# Patient Record
Sex: Female | Born: 1935 | Race: Black or African American | Hispanic: No | Smoking: Never smoker
Health system: Southern US, Community
[De-identification: ages and names within clinical notes are randomized; demographics above are authoritative.]

## PROBLEM LIST (undated history)

## (undated) DIAGNOSIS — I10 Essential (primary) hypertension: Secondary | ICD-10-CM

## (undated) DIAGNOSIS — M109 Gout, unspecified: Secondary | ICD-10-CM

## (undated) DIAGNOSIS — I82409 Acute embolism and thrombosis of unspecified deep veins of unspecified lower extremity: Secondary | ICD-10-CM

## (undated) DIAGNOSIS — K219 Gastro-esophageal reflux disease without esophagitis: Secondary | ICD-10-CM

## (undated) DIAGNOSIS — E7800 Pure hypercholesterolemia, unspecified: Secondary | ICD-10-CM

## (undated) DIAGNOSIS — G629 Polyneuropathy, unspecified: Secondary | ICD-10-CM

## (undated) HISTORY — PX: JOINT REPLACEMENT: SHX530

## (undated) HISTORY — PX: ABDOMINAL HYSTERECTOMY: SHX81

---

## 2004-02-18 ENCOUNTER — Ambulatory Visit: Payer: Self-pay | Admitting: Cardiology

## 2004-11-09 ENCOUNTER — Ambulatory Visit: Payer: Self-pay | Admitting: Family Medicine

## 2005-12-14 ENCOUNTER — Ambulatory Visit: Payer: Self-pay | Admitting: Family Medicine

## 2005-12-28 ENCOUNTER — Other Ambulatory Visit: Payer: Self-pay

## 2006-01-09 ENCOUNTER — Inpatient Hospital Stay: Payer: Self-pay | Admitting: General Practice

## 2006-06-04 ENCOUNTER — Ambulatory Visit: Payer: Self-pay | Admitting: Gastroenterology

## 2006-12-18 ENCOUNTER — Ambulatory Visit: Payer: Self-pay | Admitting: Family Medicine

## 2007-12-19 ENCOUNTER — Ambulatory Visit: Payer: Self-pay | Admitting: Family Medicine

## 2009-01-28 ENCOUNTER — Ambulatory Visit: Payer: Self-pay | Admitting: Family Medicine

## 2010-01-31 ENCOUNTER — Ambulatory Visit: Payer: Self-pay | Admitting: Family Medicine

## 2010-05-13 ENCOUNTER — Ambulatory Visit: Payer: Self-pay | Admitting: Family Medicine

## 2011-03-16 ENCOUNTER — Ambulatory Visit: Payer: Self-pay | Admitting: Family Medicine

## 2012-02-15 ENCOUNTER — Ambulatory Visit: Payer: Self-pay | Admitting: Gastroenterology

## 2012-04-10 ENCOUNTER — Ambulatory Visit: Payer: Self-pay | Admitting: Family Medicine

## 2013-09-11 ENCOUNTER — Ambulatory Visit: Payer: Self-pay | Admitting: Family Medicine

## 2014-06-09 ENCOUNTER — Ambulatory Visit: Payer: Self-pay | Admitting: Family Medicine

## 2014-12-26 ENCOUNTER — Emergency Department: Payer: Medicare Other

## 2014-12-26 ENCOUNTER — Encounter: Payer: Self-pay | Admitting: Medical Oncology

## 2014-12-26 ENCOUNTER — Emergency Department
Admission: EM | Admit: 2014-12-26 | Discharge: 2014-12-26 | Disposition: A | Discharge status: Home / Self Care | Payer: Medicare Other | Attending: Emergency Medicine | Admitting: Emergency Medicine

## 2014-12-26 DIAGNOSIS — M25572 Pain in left ankle and joints of left foot: Secondary | ICD-10-CM | POA: Diagnosis not present

## 2014-12-26 DIAGNOSIS — I10 Essential (primary) hypertension: Secondary | ICD-10-CM | POA: Diagnosis not present

## 2014-12-26 DIAGNOSIS — M25562 Pain in left knee: Secondary | ICD-10-CM | POA: Insufficient documentation

## 2014-12-26 DIAGNOSIS — M79662 Pain in left lower leg: Secondary | ICD-10-CM | POA: Diagnosis present

## 2014-12-26 DIAGNOSIS — R52 Pain, unspecified: Secondary | ICD-10-CM

## 2014-12-26 DIAGNOSIS — M1990 Unspecified osteoarthritis, unspecified site: Secondary | ICD-10-CM | POA: Insufficient documentation

## 2014-12-26 HISTORY — DX: Acute embolism and thrombosis of unspecified deep veins of unspecified lower extremity: I82.409

## 2014-12-26 HISTORY — DX: Gastro-esophageal reflux disease without esophagitis: K21.9

## 2014-12-26 HISTORY — DX: Essential (primary) hypertension: I10

## 2014-12-26 HISTORY — DX: Pure hypercholesterolemia, unspecified: E78.00

## 2014-12-26 HISTORY — DX: Gout, unspecified: M10.9

## 2014-12-26 HISTORY — DX: Polyneuropathy, unspecified: G62.9

## 2014-12-26 MED ORDER — PREDNISONE 10 MG PO TABS: 10.0000 mg | ORAL_TABLET | Freq: Every day | ORAL | Status: DC

## 2014-12-26 MED ORDER — PREDNISONE 20 MG PO TABS
ORAL_TABLET | ORAL | Status: AC
  Filled 2014-12-26: qty 1

## 2014-12-26 MED ORDER — OXYCODONE-ACETAMINOPHEN 5-325 MG PO TABS
2.0000 | ORAL_TABLET | Freq: Once | ORAL | Status: AC
  Administered 2014-12-26: 2 via ORAL
  Filled 2014-12-26: qty 2

## 2014-12-26 MED ORDER — OXYCODONE-ACETAMINOPHEN 5-325 MG PO TABS: 1.0000 | ORAL_TABLET | Freq: Four times a day (QID) | ORAL | Status: DC | PRN

## 2014-12-26 MED ORDER — PREDNISONE 20 MG PO TABS
60.0000 mg | ORAL_TABLET | Freq: Once | ORAL | Status: AC
  Administered 2014-12-26: 60 mg via ORAL
  Filled 2014-12-26: qty 3

## 2014-12-26 NOTE — ED Notes (Signed)
Pt reports that she has been having left lower leg pain from calf down to ankle for 2 weeks without injury. Reports pain worsened today. Painful to touch. Good pulse present in foot. Denies sob.

## 2014-12-26 NOTE — Discharge Instructions (Signed)
Arthritis, Nonspecific °Arthritis is inflammation of a joint. This usually means pain, redness, warmth or swelling are present. One or more joints may be involved. There are a number of types of arthritis. Your caregiver may not be able to tell what type of arthritis you have right away. °CAUSES  °The most common cause of arthritis is the wear and tear on the joint (osteoarthritis). This causes damage to the cartilage, which can break down over time. The knees, hips, back and neck are most often affected by this type of arthritis. °Other types of arthritis and common causes of joint pain include: °· Sprains and other injuries near the joint. Sometimes minor sprains and injuries cause pain and swelling that develop hours later. °· Rheumatoid arthritis. This affects hands, feet and knees. It usually affects both sides of your body at the same time. It is often associated with chronic ailments, fever, weight loss and general weakness. °· Crystal arthritis. Gout and pseudo gout can cause occasional acute severe pain, redness and swelling in the foot, ankle, or knee. °· Infectious arthritis. Bacteria can get into a joint through a break in overlying skin. This can cause infection of the joint. Bacteria and viruses can also spread through the blood and affect your joints. °· Drug, infectious and allergy reactions. Sometimes joints can become mildly painful and slightly swollen with these types of illnesses. °SYMPTOMS  °· Pain is the main symptom. °· Your joint or joints can also be red, swollen and warm or hot to the touch. °· You may have a fever with certain types of arthritis, or even feel overall ill. °· The joint with arthritis will hurt with movement. Stiffness is present with some types of arthritis. °DIAGNOSIS  °Your caregiver will suspect arthritis based on your description of your symptoms and on your exam. Testing may be needed to find the type of arthritis: °· Blood and sometimes urine tests. °· X-ray tests  and sometimes CT or MRI scans. °· Removal of fluid from the joint (arthrocentesis) is done to check for bacteria, crystals or other causes. Your caregiver (or a specialist) will numb the area over the joint with a local anesthetic, and use a needle to remove joint fluid for examination. This procedure is only minimally uncomfortable. °· Even with these tests, your caregiver may not be able to tell what kind of arthritis you have. Consultation with a specialist (rheumatologist) may be helpful. °TREATMENT  °Your caregiver will discuss with you treatment specific to your type of arthritis. If the specific type cannot be determined, then the following general recommendations may apply. °Treatment of severe joint pain includes: °· Rest. °· Elevation. °· Anti-inflammatory medication (for example, ibuprofen) may be prescribed. Avoiding activities that cause increased pain. °· Only take over-the-counter or prescription medicines for pain and discomfort as recommended by your caregiver. °· Cold packs over an inflamed joint may be used for 10 to 15 minutes every hour. Hot packs sometimes feel better, but do not use overnight. Do not use hot packs if you are diabetic without your caregiver's permission. °· A cortisone shot into arthritic joints may help reduce pain and swelling. °· Any acute arthritis that gets worse over the next 1 to 2 days needs to be looked at to be sure there is no joint infection. °Long-term arthritis treatment involves modifying activities and lifestyle to reduce joint stress jarring. This can include weight loss. Also, exercise is needed to nourish the joint cartilage and remove waste. This helps keep the muscles   around the joint strong. °HOME CARE INSTRUCTIONS  °· Do not take aspirin to relieve pain if gout is suspected. This elevates uric acid levels. °· Only take over-the-counter or prescription medicines for pain, discomfort or fever as directed by your caregiver. °· Rest the joint as much as  possible. °· If your joint is swollen, keep it elevated. °· Use crutches if the painful joint is in your leg. °· Drinking plenty of fluids may help for certain types of arthritis. °· Follow your caregiver's dietary instructions. °· Try low-impact exercise such as: °¨ Swimming. °¨ Water aerobics. °¨ Biking. °¨ Walking. °· Morning stiffness is often relieved by a warm shower. °· Put your joints through regular range-of-motion. °SEEK MEDICAL CARE IF:  °· You do not feel better in 24 hours or are getting worse. °· You have side effects to medications, or are not getting better with treatment. °SEEK IMMEDIATE MEDICAL CARE IF:  °· You have a fever. °· You develop severe joint pain, swelling or redness. °· Many joints are involved and become painful and swollen. °· There is severe back pain and/or leg weakness. °· You have loss of bowel or bladder control. °Document Released: 05/25/2004 Document Revised: 07/10/2011 Document Reviewed: 06/10/2008 °ExitCare® Patient Information ©2015 [employer]. This information is not intended to replace advice given to you by your health care provider. Make sure you discuss any questions you have with your health care provider. ° °Gout °Gout is an inflammatory arthritis caused by a buildup of uric acid crystals in the joints. Uric acid is a chemical that is normally present in the blood. When the level of uric acid in the blood is too high it can form crystals that deposit in your joints and tissues. This causes joint redness, soreness, and swelling (inflammation). Repeat attacks are common. Over time, uric acid crystals can form into masses (tophi) near a joint, destroying bone and causing disfigurement. Gout is treatable and often preventable. °CAUSES  °The disease begins with elevated levels of uric acid in the blood. Uric acid is produced by your body when it breaks down a naturally found substance called purines. Certain foods you eat, such as meats and fish, contain high amounts of  purines. Causes of an elevated uric acid level include: °· Being passed down from parent to child (heredity). °· Diseases that cause increased uric acid production (such as obesity, psoriasis, and certain cancers). °· Excessive alcohol use. °· Diet, especially diets rich in meat and seafood. °· Medicines, including certain cancer-fighting medicines (chemotherapy), water pills (diuretics), and aspirin. °· Chronic kidney disease. The kidneys are no longer able to remove uric acid well. °· Problems with metabolism. °Conditions strongly associated with gout include: °· Obesity. °· High blood pressure. °· High cholesterol. °· Diabetes. °Not everyone with elevated uric acid levels gets gout. It is not understood why some people get gout and others do not. Surgery, joint injury, and eating too much of certain foods are some of the factors that can lead to gout attacks. °SYMPTOMS  °· An attack of gout comes on quickly. It causes intense pain with redness, swelling, and warmth in a joint. °· Fever can occur. °· Often, only one joint is involved. Certain joints are more commonly involved: °¨ Base of the big toe. °¨ Knee. °¨ Ankle. °¨ Wrist. °¨ Finger. °Without treatment, an attack usually goes away in a few days to weeks. Between attacks, you usually will not have symptoms, which is different from many other forms of arthritis. °DIAGNOSIS  °  Your caregiver will suspect gout based on your symptoms and exam. In some cases, tests may be recommended. The tests may include: °· Blood tests. °· Urine tests. °· X-rays. °· Joint fluid exam. This exam requires a needle to remove fluid from the joint (arthrocentesis). Using a microscope, gout is confirmed when uric acid crystals are seen in the joint fluid. °TREATMENT  °There are two phases to gout treatment: treating the sudden onset (acute) attack and preventing attacks (prophylaxis). °· Treatment of an Acute Attack. °¨ Medicines are used. These include anti-inflammatory medicines or  steroid medicines. °¨ An injection of steroid medicine into the affected joint is sometimes necessary. °¨ The painful joint is rested. Movement can worsen the arthritis. °¨ You may use warm or cold treatments on painful joints, depending which works best for you. °· Treatment to Prevent Attacks. °¨ If you suffer from frequent gout attacks, your caregiver may advise preventive medicine. These medicines are started after the acute attack subsides. These medicines either help your kidneys eliminate uric acid from your body or decrease your uric acid production. You may need to stay on these medicines for a very long time. °¨ The early phase of treatment with preventive medicine can be associated with an increase in acute gout attacks. For this reason, during the first few months of treatment, your caregiver may also advise you to take medicines usually used for acute gout treatment. Be sure you understand your caregiver's directions. Your caregiver may make several adjustments to your medicine dose before these medicines are effective. °¨ Discuss dietary treatment with your caregiver or dietitian. Alcohol and drinks high in sugar and fructose and foods such as meat, poultry, and seafood can increase uric acid levels. Your caregiver or dietitian can advise you on drinks and foods that should be limited. °HOME CARE INSTRUCTIONS  °· Do not take aspirin to relieve pain. This raises uric acid levels. °· Only take over-the-counter or prescription medicines for pain, discomfort, or fever as directed by your caregiver. °· Rest the joint as much as possible. When in bed, keep sheets and blankets off painful areas. °· Keep the affected joint raised (elevated). °· Apply warm or cold treatments to painful joints. Use of warm or cold treatments depends on which works best for you. °· Use crutches if the painful joint is in your leg. °· Drink enough fluids to keep your urine clear or pale yellow. This helps your body get rid of uric  acid. Limit alcohol, sugary drinks, and fructose drinks. °· Follow your dietary instructions. Pay careful attention to the amount of protein you eat. Your daily diet should emphasize fruits, vegetables, whole grains, and fat-free or low-fat milk products. Discuss the use of coffee, vitamin C, and cherries with your caregiver or dietitian. These may be helpful in lowering uric acid levels. °· Maintain a healthy body weight. °SEEK MEDICAL CARE IF:  °· You develop diarrhea, vomiting, or any side effects from medicines. °· You do not feel better in 24 hours, or you are getting worse. °SEEK IMMEDIATE MEDICAL CARE IF:  °· Your joint becomes suddenly more tender, and you have chills or a fever. °MAKE SURE YOU:  °· Understand these instructions. °· Will watch your condition. °· Will get help right away if you are not doing well or get worse. °Document Released: 04/14/2000 Document Revised: 09/01/2013 Document Reviewed: 11/29/2011 °ExitCare® Patient Information ©2015 [employer]. This information is not intended to replace advice given to you by your health care provider. Make   sure you discuss any questions you have with your health care provider. ° °

## 2014-12-26 NOTE — ED Provider Notes (Signed)
[facility] Emergency Department Provider Note     Time seen: ----------------------------------------- 5:55 PM on 12/26/2014 -----------------------------------------    I have reviewed the triage vital signs and the nursing notes.   HISTORY  Chief Complaint Leg Pain    HPI Destiny Ruiz is a 81 y.o. female who presents ER with left lower leg pain from Down to the ankle. Patient states pain is been going on for 2 weeks without knee injury. Patient reports pain worsened today, is been painful to touch, worse with range of motion. She does have history of gout, denies fevers chills or other complaints   Past Medical History  Diagnosis Date  . Neuropathy   . Hypertension   . Gout   . GERD (gastroesophageal reflux disease)   . High cholesterol   . DVT (deep venous thrombosis)     There are no active problems to display for this patient.   Past Surgical History  Procedure Laterality Date  . Joint replacement    . Abdominal hysterectomy      Allergies Ace inhibitors and Lipitor  Social History Social History  Substance Use Topics  . Smoking status: Never Smoker   . Smokeless tobacco: None  . Alcohol Use: No    Review of Systems Constitutional: Negative for fever. Eyes: Negative for visual changes. ENT: Negative for sore throat. Cardiovascular: Negative for chest pain. Respiratory: Negative for shortness of breath. Gastrointestinal: Negative for abdominal pain, vomiting and diarrhea. Genitourinary: Negative for dysuria. Musculoskeletal: Positive for left lower extremity pain from knee to ankle Skin: Negative for rash. Neurological: Negative for headaches, focal weakness or numbness.  10-point ROS otherwise negative.  ____________________________________________   PHYSICAL EXAM:  VITAL SIGNS: ED Triage Vitals  Enc Vitals Group     BP 12/26/14 1549 121/68 mmHg     Pulse Rate 12/26/14 1549 57     Resp 12/26/14 1549 18      Temp 12/26/14 1549 98.4 F (36.9 C)     Temp Source 12/26/14 1549 Oral     SpO2 12/26/14 1549 97 %     Weight 12/26/14 1549 251 lb (113.853 kg)     Height 12/26/14 1549 5\' 5"  (1.651 m)     Head Cir --      Peak Flow --      Pain Score 12/26/14 1550 10     Pain Loc --      Pain Edu? --      Excl. in GC? --     Constitutional: Alert and oriented. Well appearing and in no distress. Eyes: Conjunctivae are normal. PERRL. Normal extraocular movements. Cardiovascular: Normal rate, regular rhythm. Normal and symmetric distal pulses are present in all extremities. No murmurs, rubs, or gallops. Respiratory: Normal respiratory effort without tachypnea nor retractions. Breath sounds are clear and equal bilaterally. No wheezes/rales/rhonchi. Gastrointestinal: Soft and nontender. No distention. No abdominal bruits.  Musculoskeletal: Mild pain range of motion of left knee and left ankle. There may be mild joint effusions. Neurologic:  Normal speech and language. No gross focal neurologic deficits are appreciated. Speech is normal. No gait instability. Skin:  Skin is warm, dry and intact. No rash noted. Psychiatric: Mood and affect are normal. Speech and behavior are normal. Patient exhibits appropriate insight and judgment. ____________________________________________  ED COURSE:  Pertinent labs & imaging results that were available during my care of the patient were reviewed by me and considered in my medical decision making (see chart for details). Patient with nonspecific left  lower extremity pain and swelling. Will receive ultrasound and plain x-rays ____________________________________________  RADIOLOGY Images were viewed by me   left lower extremity Doppler, tib-fib x-rays IMPRESSION: No evidence of deep venous thrombosis.  IMPRESSION: No acute findings.  Moderate osteoarthritic change of the knee and mild degenerative change of the  ankle. ____________________________________________  FINAL ASSESSMENT AND PLAN  Lower extremity pain  Plan: Patient with labs and imaging as dictated above. Patient with arthropathy, likely gouty in origin. She does have osteoarthritis as evidenced on x-ray. Ultrasound is negative. She was discharged with steroid taper, pain medication and close follow-up with her doctor.   Emily Filbert, MD   Emily Filbert, MD 12/26/14 [phone]

## 2016-01-20 ENCOUNTER — Emergency Department: Payer: Medicare Other

## 2016-01-20 ENCOUNTER — Encounter: Payer: Self-pay | Admitting: Emergency Medicine

## 2016-01-20 ENCOUNTER — Emergency Department
Admission: EM | Admit: 2016-01-20 | Discharge: 2016-01-20 | Disposition: A | Discharge status: Home / Self Care | Payer: Medicare Other | Attending: Emergency Medicine | Admitting: Emergency Medicine

## 2016-01-20 DIAGNOSIS — M7122 Synovial cyst of popliteal space [Baker], left knee: Secondary | ICD-10-CM | POA: Insufficient documentation

## 2016-01-20 DIAGNOSIS — I10 Essential (primary) hypertension: Secondary | ICD-10-CM | POA: Insufficient documentation

## 2016-01-20 DIAGNOSIS — M7989 Other specified soft tissue disorders: Secondary | ICD-10-CM

## 2016-01-20 DIAGNOSIS — Z86718 Personal history of other venous thrombosis and embolism: Secondary | ICD-10-CM

## 2016-01-20 DIAGNOSIS — M79605 Pain in left leg: Secondary | ICD-10-CM

## 2016-01-20 NOTE — ED Triage Notes (Signed)
Pt comes into the ED via POV c/o headache that has been ongoing for 1 month as well as lower left leg pain.  Patient has history of DVT in that leg over 30 years ago.  Patient called her PCP who said they would like her to come here for evaluation on a blood clot.  Patient in NAD at this time.  Denies any chest pain, shortness of breath, or dizziness. Patient able to ambulate using walker to the triage room.

## 2016-01-20 NOTE — ED Notes (Signed)
Pt sent by Dr.Patel for left lower leg pain for greater than 1 month, no obvious edema noted to left leg pt also complains of a right sided headache for 6 months

## 2016-01-20 NOTE — ED Provider Notes (Signed)
[facility] Emergency Department Provider Note   ____________________________________________    I have reviewed the triage vital signs and the nursing notes.   HISTORY  Chief Complaint Left lower leg pain    HPI DJUANA LITTLETON is a 82 y.o. female who presents with complaints of left leg pain behind her left knee and traveling down her posterior left calf. This is been occurring for approximately one week. Her PCP referred her to the emergency department to be evaluated for possible DVT. She denies shortness of breath. No pleurisy. No fevers. No rash. No swelling. She does have a history of a DVT in the past.   Past Medical History:  Diagnosis Date  . DVT (deep venous thrombosis) (HCC)   . GERD (gastroesophageal reflux disease)   . Gout   . High cholesterol   . Hypertension   . Neuropathy (HCC)     There are no active problems to display for this patient.   Past Surgical History:  Procedure Laterality Date  . ABDOMINAL HYSTERECTOMY    . JOINT REPLACEMENT      Prior to Admission medications   Medication Sig Start Date End Date Taking? Authorizing Provider  oxyCODONE-acetaminophen (ROXICET) 5-325 MG per tablet Take 1 tablet by mouth every 6 (six) hours as needed. 12/26/14   Emily Filbert, MD  predniSONE (DELTASONE) 10 MG tablet Take 1 tablet (10 mg total) by mouth daily. 12/26/14   Emily Filbert, MD     Allergies Ace inhibitors and Lipitor [atorvastatin]  No family history on file.  Social History Social History  Substance Use Topics  . Smoking status: Never Smoker  . Smokeless tobacco: Never Used  . Alcohol use No    Review of Systems  Constitutional: No fever/chills Eyes: No visual changes.   Cardiovascular: Denies chest pain. Respiratory: Denies shortness of breath.  Musculoskeletal: Calf pain as above Skin: Negative for rash. Neurological: Negative for headaches   10-point ROS otherwise  negative.  ____________________________________________   PHYSICAL EXAM:  VITAL SIGNS: ED Triage Vitals  Enc Vitals Group     BP 01/20/16 1141 (!) 151/85     Pulse Rate 01/20/16 1141 60     Resp 01/20/16 1141 20     Temp 01/20/16 1141 99.2 F (37.3 C)     Temp Source 01/20/16 1141 Oral     SpO2 01/20/16 1141 97 %     Weight 01/20/16 1141 252 lb (114.3 kg)     Height 01/20/16 1141 5\' 5"  (1.651 m)     Head Circumference --      Peak Flow --      Pain Score 01/20/16 1142 10     Pain Loc --      Pain Edu? --      Excl. in GC? --     Constitutional: Alert and oriented. No acute distress.  Eyes: Conjunctivae are normal.  Head: Atraumatic. Nose: No congestion/rhinnorhea. Mouth/Throat: Mucous membranes are moist.    Cardiovascular: Normal rate, regular rhythm. Good peripheral circulation. Respiratory: Normal respiratory effort.  No retractions. Lungs CTAB. Gastrointestinal: Soft and nontender. No distention.   Genitourinary: deferred Musculoskeletal: Mild tenderness in the posterior left knee. No significant calf swelling or tenderness. No erythema.  Warm and well perfused feet bilaterally Neurologic:  Normal speech and language. No gross focal neurologic deficits are appreciated.  Skin:  Skin is warm, dry and intact. No rash noted. Psychiatric: Mood and affect are normal. Speech and behavior are normal.  ____________________________________________   LABS (all labs ordered are listed, but only abnormal results are displayed)  Labs Reviewed - No data to display ____________________________________________  EKG  None ____________________________________________  RADIOLOGY  Ultrasound negative for DVT ____________________________________________   PROCEDURES  Procedure(s) performed: No    Critical Care performed: No ____________________________________________   INITIAL IMPRESSION / ASSESSMENT AND PLAN / ED COURSE  Pertinent labs & imaging results that  were available during my care of the patient were reviewed by me and considered in my medical decision making (see chart for details).  Patient presents with complaints of left-sided leg pain. Ultrasound was consistent with a Baker's cyst. I will have her follow-up with her orthopedist Dr. Ernest PineHooten. No DVT  Clinical Course   ____________________________________________   FINAL CLINICAL IMPRESSION(S) / ED DIAGNOSES  Final diagnoses:  Baker's cyst, left      NEW MEDICATIONS STARTED DURING THIS VISIT:  Discharge Medication List as of 01/20/2016  1:54 PM       Note:  This document was prepared using Dragon voice recognition software and may include unintentional dictation errors.    Jene Everyobert Yogesh Cominsky, MD 01/20/16 1600

## 2016-01-20 NOTE — ED Notes (Signed)
Pt currently in US. Will assess when finished.

## 2018-04-11 ENCOUNTER — Emergency Department (HOSPITAL_COMMUNITY): Payer: Medicare Other

## 2018-04-11 ENCOUNTER — Inpatient Hospital Stay (HOSPITAL_COMMUNITY): Payer: Medicare Other

## 2018-04-11 ENCOUNTER — Other Ambulatory Visit: Payer: Self-pay

## 2018-04-11 ENCOUNTER — Encounter (HOSPITAL_COMMUNITY): Payer: Self-pay | Admitting: Emergency Medicine

## 2018-04-11 ENCOUNTER — Observation Stay (HOSPITAL_COMMUNITY)
Admission: EM | Admit: 2018-04-11 | Discharge: 2018-04-12 | Disposition: A | Discharge status: Home / Self Care | Payer: Medicare Other | Attending: Student in an Organized Health Care Education/Training Program | Admitting: Student in an Organized Health Care Education/Training Program

## 2018-04-11 DIAGNOSIS — Z79899 Other long term (current) drug therapy: Secondary | ICD-10-CM | POA: Diagnosis not present

## 2018-04-11 DIAGNOSIS — R51 Headache: Secondary | ICD-10-CM

## 2018-04-11 DIAGNOSIS — M109 Gout, unspecified: Secondary | ICD-10-CM | POA: Insufficient documentation

## 2018-04-11 DIAGNOSIS — R0600 Dyspnea, unspecified: Secondary | ICD-10-CM

## 2018-04-11 DIAGNOSIS — R531 Weakness: Secondary | ICD-10-CM

## 2018-04-11 DIAGNOSIS — Z7982 Long term (current) use of aspirin: Secondary | ICD-10-CM | POA: Insufficient documentation

## 2018-04-11 DIAGNOSIS — I10 Essential (primary) hypertension: Secondary | ICD-10-CM | POA: Diagnosis not present

## 2018-04-11 DIAGNOSIS — G629 Polyneuropathy, unspecified: Secondary | ICD-10-CM | POA: Insufficient documentation

## 2018-04-11 DIAGNOSIS — Z86718 Personal history of other venous thrombosis and embolism: Secondary | ICD-10-CM | POA: Insufficient documentation

## 2018-04-11 DIAGNOSIS — R519 Headache, unspecified: Secondary | ICD-10-CM

## 2018-04-11 DIAGNOSIS — I639 Cerebral infarction, unspecified: Secondary | ICD-10-CM | POA: Diagnosis not present

## 2018-04-11 DIAGNOSIS — G459 Transient cerebral ischemic attack, unspecified: Secondary | ICD-10-CM | POA: Diagnosis not present

## 2018-04-11 DIAGNOSIS — K219 Gastro-esophageal reflux disease without esophagitis: Secondary | ICD-10-CM | POA: Insufficient documentation

## 2018-04-11 DIAGNOSIS — E785 Hyperlipidemia, unspecified: Secondary | ICD-10-CM | POA: Insufficient documentation

## 2018-04-11 LAB — RAPID URINE DRUG SCREEN, HOSP PERFORMED
Amphetamines: NOT DETECTED
Barbiturates: NOT DETECTED
Benzodiazepines: NOT DETECTED
Cocaine: NOT DETECTED
Opiates: NOT DETECTED
Tetrahydrocannabinol: NOT DETECTED

## 2018-04-11 LAB — URINALYSIS, ROUTINE W REFLEX MICROSCOPIC
Bilirubin Urine: NEGATIVE
GLUCOSE, UA: NEGATIVE mg/dL
Ketones, ur: NEGATIVE mg/dL
Leukocytes, UA: NEGATIVE
Nitrite: NEGATIVE
PH: 9 — AB (ref 5.0–8.0)
Protein, ur: NEGATIVE mg/dL
Specific Gravity, Urine: 1.012 (ref 1.005–1.030)

## 2018-04-11 LAB — DIFFERENTIAL
Abs Immature Granulocytes: 0.02 10*3/uL (ref 0.00–0.07)
BASOS ABS: 0 10*3/uL (ref 0.0–0.1)
Basophils Relative: 1 %
Eosinophils Absolute: 0.2 10*3/uL (ref 0.0–0.5)
Eosinophils Relative: 4 %
Immature Granulocytes: 0 %
Lymphocytes Relative: 36 %
Lymphs Abs: 2.3 10*3/uL (ref 0.7–4.0)
Monocytes Absolute: 0.6 10*3/uL (ref 0.1–1.0)
Monocytes Relative: 9 %
Neutro Abs: 3.2 10*3/uL (ref 1.7–7.7)
Neutrophils Relative %: 50 %

## 2018-04-11 LAB — I-STAT CHEM 8, ED
BUN: 16 mg/dL (ref 8–23)
CALCIUM ION: 1.11 mmol/L — AB (ref 1.15–1.40)
Chloride: 107 mmol/L (ref 98–111)
Creatinine, Ser: 0.9 mg/dL (ref 0.44–1.00)
GLUCOSE: 87 mg/dL (ref 70–99)
HEMATOCRIT: 35 % — AB (ref 36.0–46.0)
Hemoglobin: 11.9 g/dL — ABNORMAL LOW (ref 12.0–15.0)
POTASSIUM: 4 mmol/L (ref 3.5–5.1)
SODIUM: 144 mmol/L (ref 135–145)
TCO2: 29 mmol/L (ref 22–32)

## 2018-04-11 LAB — COMPREHENSIVE METABOLIC PANEL
ALT: 8 U/L (ref 0–44)
AST: 14 U/L — ABNORMAL LOW (ref 15–41)
Albumin: 3.7 g/dL (ref 3.5–5.0)
Alkaline Phosphatase: 38 U/L (ref 38–126)
Anion gap: 12 (ref 5–15)
BUN: 14 mg/dL (ref 8–23)
CO2: 24 mmol/L (ref 22–32)
Calcium: 9 mg/dL (ref 8.9–10.3)
Chloride: 109 mmol/L (ref 98–111)
Creatinine, Ser: 0.86 mg/dL (ref 0.44–1.00)
GFR calc Af Amer: >60 mL/min (ref >60)
GLUCOSE: 91 mg/dL (ref 70–99)
Potassium: 4.1 mmol/L (ref 3.5–5.1)
Sodium: 145 mmol/L (ref 135–145)
Total Bilirubin: 0.8 mg/dL (ref 0.3–1.2)
Total Protein: 7.3 g/dL (ref 6.5–8.1)

## 2018-04-11 LAB — CBC
HCT: 37.5 % (ref 36.0–46.0)
Hemoglobin: 10.8 g/dL — ABNORMAL LOW (ref 12.0–15.0)
MCH: 25.3 pg — ABNORMAL LOW (ref 26.0–34.0)
MCHC: 28.8 g/dL — ABNORMAL LOW (ref 30.0–36.0)
MCV: 87.8 fL (ref 80.0–100.0)
Platelets: 144 10*3/uL — ABNORMAL LOW (ref 150–400)
RBC: 4.27 MIL/uL (ref 3.87–5.11)
RDW: 15.9 % — ABNORMAL HIGH (ref 11.5–15.5)
WBC: 6.4 10*3/uL (ref 4.0–10.5)
nRBC: 0 % (ref 0.0–0.2)

## 2018-04-11 LAB — I-STAT TROPONIN, ED: Troponin i, poc: 0 ng/mL (ref 0.00–0.08)

## 2018-04-11 LAB — APTT: APTT: 27 s (ref 24–36)

## 2018-04-11 LAB — CBG MONITORING, ED: Glucose-Capillary: 75 mg/dL (ref 70–99)

## 2018-04-11 LAB — PROTIME-INR
INR: 0.98
PROTHROMBIN TIME: 12.9 s (ref 11.4–15.2)

## 2018-04-11 LAB — ETHANOL

## 2018-04-11 LAB — TROPONIN I: Troponin I: <0.03 ng/mL (ref <0.03)

## 2018-04-11 MED ORDER — IOPAMIDOL (ISOVUE-370) INJECTION 76%
100.0000 mL | Freq: Once | INTRAVENOUS | Status: AC | PRN
  Administered 2018-04-11: 100 mL via INTRAVENOUS

## 2018-04-11 MED ORDER — ACETAMINOPHEN 160 MG/5ML PO SOLN: 650.0000 mg | ORAL | Status: DC | PRN

## 2018-04-11 MED ORDER — STROKE: EARLY STAGES OF RECOVERY BOOK
Freq: Once | Status: AC
  Administered 2018-04-11: 18:00:00
  Filled 2018-04-11: qty 1

## 2018-04-11 MED ORDER — ENOXAPARIN SODIUM 40 MG/0.4ML ~~LOC~~ SOLN
40.0000 mg | SUBCUTANEOUS | Status: DC
  Administered 2018-04-11 – 2018-04-12 (×2): 40 mg via SUBCUTANEOUS
  Filled 2018-04-11 (×3): qty 0.4

## 2018-04-11 MED ORDER — ROSUVASTATIN CALCIUM 20 MG PO TABS
20.0000 mg | ORAL_TABLET | Freq: Every day | ORAL | Status: DC
  Administered 2018-04-11: 20 mg via ORAL
  Filled 2018-04-11: qty 1

## 2018-04-11 MED ORDER — ACETAMINOPHEN 650 MG RE SUPP: 650.0000 mg | RECTAL | Status: DC | PRN

## 2018-04-11 MED ORDER — SENNOSIDES-DOCUSATE SODIUM 8.6-50 MG PO TABS: 1.0000 | ORAL_TABLET | Freq: Every evening | ORAL | Status: DC | PRN

## 2018-04-11 MED ORDER — ASPIRIN EC 81 MG PO TBEC
81.0000 mg | DELAYED_RELEASE_TABLET | Freq: Every day | ORAL | Status: DC
  Administered 2018-04-12: 81 mg via ORAL
  Filled 2018-04-11: qty 1

## 2018-04-11 MED ORDER — ACETAMINOPHEN 325 MG PO TABS
650.0000 mg | ORAL_TABLET | ORAL | Status: DC | PRN
  Administered 2018-04-11: 650 mg via ORAL
  Filled 2018-04-11: qty 2

## 2018-04-11 NOTE — Code Documentation (Signed)
84 yo Female with hx of Hypertension coming to ED with complaints of waking up with 10/10 headache. Pt went to the bathroom and noticed left sensory changes and weakness of the left arm and leg that started while in the shower. Pt called EMS and EMS arrived to activate a Code Stroke. Stroke team met patient upon arrival. Initial NIHSS 5 due to left arm drift with pain, left leg weakness, and sensory decreased on the left side. Pt is not a tPA candidate due to being outside the window. CT Head negative for hemorrhage. Pt hard stick and [place] needed to place IV. CTA completed. NO signs of LVO. Handoff given to [place], [place]. Code Stroke cancelled due to neurology believe stroke not suspected.

## 2018-04-11 NOTE — ED Notes (Signed)
ED TO INPATIENT HANDOFF REPORT  ED Nurse Name and Phone #: Rise Paganini [phone]  Name/Age/Gender Destiny Ruiz 84 y.o. female Room/Bed: 034C/034C  Code Status   Code Status: DNR  Home/SNF/Other Home Patient oriented to: self, place, time and situation Is this baseline? Yes   Triage Complete: Triage complete   Chief Complaint CODE STROKE  Triage Note Pt arrives to ED from home with a Code Stroke called from [facility]. EMS reports pt had acute onset of LUE and LLE weakness in addition to 10/10 bifrontal headache. Pt's headache started first while in the shower she noticed that her left side was weak. LKN 0500.    Allergies Allergies  Allergen Reactions  . Ace Inhibitors Other (See Comments)    Cramping   . Lipitor [Atorvastatin] Other (See Comments)    Cramping     Level of Care/Admitting Diagnosis ED Disposition    ED Disposition Condition Comment   Admit  Hospital [facility] [[id number]]  Level of Care: Medical Telemetry [104]  Diagnosis: Acute left-sided weakness [360757]  Admitting Physician: Tyson Alias [[id number]]  Attending Physician: Tyson Alias [[id number]]  Estimated length of stay: past midnight tomorrow  Certification:: I certify this patient will need inpatient services for at least 2 midnights  PT Class (Do Not Modify): Inpatient [101]  PT Acc Code (Do Not Modify): Private [1]       Medical/Surgery History Past Medical History:  Diagnosis Date  . DVT (deep venous thrombosis) (HCC)   . GERD (gastroesophageal reflux disease)   . Gout   . High cholesterol   . Hypertension   . Neuropathy    Past Surgical History:  Procedure Laterality Date  . ABDOMINAL HYSTERECTOMY    . JOINT REPLACEMENT       IV Location/Drains/Wounds Patient Lines/Drains/Airways Status   Active Line/Drains/Airways    Name:   Placement date:   Placement time:   Site:   Days:   Peripheral IV 04/11/18 Left Antecubital    04/11/18    1240    Antecubital   less than 1   Peripheral IV 04/11/18 Right Forearm   04/11/18    1240    Forearm   less than 1          Intake/Output Last 24 hours No intake or output data in the 24 hours ending 04/11/18 1529  Labs/Imaging Results for orders placed or performed during the hospital encounter of 04/11/18 (from the past 48 hour(s))  CBG monitoring, ED     Status: None   Collection Time: 04/11/18 10:51 AM  Result Value Ref Range   Glucose-Capillary 75 70 - 99 mg/dL  Ethanol     Status: None   Collection Time: 04/11/18 10:55 AM  Result Value Ref Range   Alcohol, Ethyl (B) <10 <10 mg/dL    Comment: (NOTE) Lowest detectable limit for serum alcohol is 10 mg/dL. For medical purposes only. Performed at [facility], [address], [place], [place] [address]   Protime-INR     Status: None   Collection Time: 04/11/18 10:55 AM  Result Value Ref Range   Prothrombin Time 12.9 11.4 - 15.2 seconds   INR 0.98     Comment: Performed at [facility], [address] [place], [place] [address]  APTT     Status: None   Collection Time: 04/11/18 10:55 AM  Result Value Ref Range   aPTT 27 24 - 36 seconds    Comment:  Performed at [facility], [address] [place], [place] [address]  CBC     Status: Abnormal   Collection Time: 04/11/18 10:55 AM  Result Value Ref Range   WBC 6.4 4.0 - 10.5 K/uL   RBC 4.27 3.87 - 5.11 MIL/uL   Hemoglobin [facility] (L) 12.0 - 15.0 g/dL   HCT 19.137.5 47.836.0 - 29.546.0 %   MCV 87.8 80.0 - 100.0 fL   MCH 25.3 (L) 26.0 - 34.0 pg   MCHC 28.8 (L) 30.0 - 36.0 g/dL   RDW 62.115.9 (H) 30.811.5 - 65.715.5 %   Platelets 144 (L) 150 - 400 K/uL   nRBC 0.0 0.0 - 0.2 %    Comment: Performed at [facility], [address] [place], [place] [address]  Differential     Status: None   Collection Time: 04/11/18 10:55 AM  Result Value Ref Range   Neutrophils Relative % 50 %   Neutro Abs 3.2 1.7 - 7.7 K/uL   Lymphocytes Relative 36 %   Lymphs Abs 2.3  0.7 - 4.0 K/uL   Monocytes Relative 9 %   Monocytes Absolute 0.6 0.1 - 1.0 K/uL   Eosinophils Relative 4 %   Eosinophils Absolute 0.2 0.0 - 0.5 K/uL   Basophils Relative 1 %   Basophils Absolute 0.0 0.0 - 0.1 K/uL   Immature Granulocytes 0 %   Abs Immature Granulocytes 0.02 0.00 - 0.07 K/uL    Comment: Performed at [facility], [address] [place], [place] [address]  Comprehensive metabolic panel     Status: Abnormal   Collection Time: 04/11/18 10:55 AM  Result Value Ref Range   Sodium 145 135 - 145 mmol/L   Potassium 4.1 3.5 - 5.1 mmol/L   Chloride 109 98 - 111 mmol/L   CO2 24 22 - 32 mmol/L   Glucose, Bld 91 70 - 99 mg/dL   BUN 14 8 - 23 mg/dL   Creatinine, Ser 4.130.86 0.44 - 1.00 mg/dL   Calcium 9.0 8.9 - 24.410.3 mg/dL   Total Protein 7.3 6.5 - 8.1 g/dL   Albumin 3.7 3.5 - 5.0 g/dL   AST 14 (L) 15 - 41 U/L   ALT 8 0 - 44 U/L   Alkaline Phosphatase 38 38 - 126 U/L   Total Bilirubin 0.8 0.3 - 1.2 mg/dL   GFR calc non Af Amer >60 >60 mL/min   GFR calc Af Amer >60 >60 mL/min   Anion gap 12 5 - 15    Comment: Performed at [facility], [address] [place], [place] [address]  I-stat troponin, ED     Status: None   Collection Time: 04/11/18 10:56 AM  Result Value Ref Range   Troponin i, poc 0.00 0.00 - 0.08 ng/mL   Comment 3            Comment: Due to the release kinetics of cTnI, a negative result within the first hours of the onset of symptoms does not rule out myocardial infarction with certainty. If myocardial infarction is still suspected, repeat the test at appropriate intervals.   I-Stat Chem 8, ED     Status: Abnormal   Collection Time: 04/11/18 10:59 AM  Result Value Ref Range   Sodium 144 135 - 145 mmol/L   Potassium 4.0 3.5 - 5.1 mmol/L   Chloride 107 98 - 111 mmol/L   BUN 16 8 - 23 mg/dL   Creatinine, Ser 2.530.90 0.44 - 1.00  mg/dL   Glucose, Bld 87 70 - 99 mg/dL   Calcium, Ion 1.61 (L) 1.15 - 1.40 mmol/L   TCO2 29 22 - 32 mmol/L   Hemoglobin  11.9 (L) 12.0 - 15.0 g/dL   HCT 09.6 (L) 04.5 - 40.9 %  Urinalysis, Routine w reflex microscopic     Status: Abnormal   Collection Time: 04/11/18  2:38 PM  Result Value Ref Range   Color, Urine COLORLESS (A) YELLOW   APPearance CLEAR CLEAR   Specific Gravity, Urine 1.012 1.005 - 1.030   pH 9.0 (H) 5.0 - 8.0   Glucose, UA NEGATIVE NEGATIVE mg/dL   Hgb urine dipstick SMALL (A) NEGATIVE   Bilirubin Urine NEGATIVE NEGATIVE   Ketones, ur NEGATIVE NEGATIVE mg/dL   Protein, ur NEGATIVE NEGATIVE mg/dL   Nitrite NEGATIVE NEGATIVE   Leukocytes, UA NEGATIVE NEGATIVE   RBC / HPF 0-5 0 - 5 RBC/hpf   Bacteria, UA RARE (A) NONE SEEN    Comment: Performed at [facility], [address] [place], [place] [address]   Ct Angio Head W Or Wo Contrast  Result Date: 04/11/2018 CLINICAL DATA:  84 year old female code stroke with left side weakness. EXAM: CT ANGIOGRAPHY HEAD AND NECK TECHNIQUE: Multidetector CT imaging of the head and neck was performed using the standard protocol during bolus administration of intravenous contrast. Multiplanar CT image reconstructions and MIPs were obtained to evaluate the vascular anatomy. Carotid stenosis measurements (when applicable) are obtained utilizing NASCET criteria, using the distal internal carotid diameter as the denominator. CONTRAST:  75 milliliters Isovue 370. COMPARISON:  Head CT without contrast 1104 hours today. FINDINGS: CTA NECK Skeleton: Degenerative changes throughout the cervical spine. Heterogeneous vertebral so sclerosis at C4-C5 is presumably degenerative. Upper chest: Mild motion artifact. Negative lung apices. No superior mediastinal lymphadenopathy. Other neck: Retropharyngeal course of both carotid arteries (normal variant). Negative for neck mass or lymphadenopathy. Aortic arch: Ectatic aortic arch with mild to moderate soft and calcified atherosclerosis. Three vessel arch configuration. Right carotid system: Negative brachiocephalic artery and  right CCA origin. Tortuous proximal right CCA with a retropharyngeal course. Mild calcified plaque at the right ICA origin. Cervical right ICA tortuosity but no stenosis. Left carotid system: Normal left CCA origin. Tortuous left CCA with the retropharyngeal course. Mild calcified plaque at the left ICA origin. Tortuous left ICA without stenosis. Vertebral arteries: Negative proximal right subclavian artery and right vertebral artery origin. Patent right vertebral artery to the skull base without stenosis. No proximal left subclavian artery stenosis despite calcified plaque. Normal left vertebral artery origin. Tortuous left V1 segment. Tortuous left V2 segment. Patent left vertebral artery to the skull base without stenosis. CTA HEAD Posterior circulation: Codominant distal vertebral arteries with no stenosis. Normal vertebrobasilar junction. Normal right PICA origin. The left AICA may be dominant. Patent basilar artery without stenosis. Normal SCA and PCA origins. Both posterior communicating arteries are present, larger on the right. Bilateral PCA branches are within normal limits. Anterior circulation: Both ICA siphons are patent. Mild bilateral siphon dolichoectasia with mild calcified plaque and no stenosis. Normal ophthalmic artery origins. Patent carotid termini. Normal MCA and ACA origins. Anterior communicating artery and bilateral ACA branches are within normal limits. Left MCA M1 segment and bifurcation are mildly ectatic without stenosis. Left MCA branches are within normal limits. Right MCA M1 segment and right MCA bifurcation are patent without stenosis. Simplified right MCA branching pattern suspected compared to the left, but no right MCA branch occlusion identified and MCA  M2 vessel density appears symmetric (series 10, image 17). Venous sinuses: Not evaluated due to early contrast timing Anatomic variants: None. Review of the MIP images confirms the above findings IMPRESSION: 1. Negative for large  vessel occlusion or arterial stenosis in the head or neck. A simplified Right MCA branching pattern is suspected compared to the left. No Right MCA branch occlusion is identified. 2. Arterial tortuosity in the head and neck, aorta. Preliminary report of this exam discussed by telephone with Dr. Caryl Pina on 04/11/2018 at 1131 hours. Electronically Signed   By: [place] Fleming M.D.   On: 04/11/2018 11:49   Ct Angio Neck W Or Wo Contrast  Result Date: 04/11/2018 CLINICAL DATA:  84 year old female code stroke with left side weakness. EXAM: CT ANGIOGRAPHY HEAD AND NECK TECHNIQUE: Multidetector CT imaging of the head and neck was performed using the standard protocol during bolus administration of intravenous contrast. Multiplanar CT image reconstructions and MIPs were obtained to evaluate the vascular anatomy. Carotid stenosis measurements (when applicable) are obtained utilizing NASCET criteria, using the distal internal carotid diameter as the denominator. CONTRAST:  75 milliliters Isovue 370. COMPARISON:  Head CT without contrast 1104 hours today. FINDINGS: CTA NECK Skeleton: Degenerative changes throughout the cervical spine. Heterogeneous vertebral so sclerosis at C4-C5 is presumably degenerative. Upper chest: Mild motion artifact. Negative lung apices. No superior mediastinal lymphadenopathy. Other neck: Retropharyngeal course of both carotid arteries (normal variant). Negative for neck mass or lymphadenopathy. Aortic arch: Ectatic aortic arch with mild to moderate soft and calcified atherosclerosis. Three vessel arch configuration. Right carotid system: Negative brachiocephalic artery and right CCA origin. Tortuous proximal right CCA with a retropharyngeal course. Mild calcified plaque at the right ICA origin. Cervical right ICA tortuosity but no stenosis. Left carotid system: Normal left CCA origin. Tortuous left CCA with the retropharyngeal course. Mild calcified plaque at the left ICA origin. Tortuous left ICA  without stenosis. Vertebral arteries: Negative proximal right subclavian artery and right vertebral artery origin. Patent right vertebral artery to the skull base without stenosis. No proximal left subclavian artery stenosis despite calcified plaque. Normal left vertebral artery origin. Tortuous left V1 segment. Tortuous left V2 segment. Patent left vertebral artery to the skull base without stenosis. CTA HEAD Posterior circulation: Codominant distal vertebral arteries with no stenosis. Normal vertebrobasilar junction. Normal right PICA origin. The left AICA may be dominant. Patent basilar artery without stenosis. Normal SCA and PCA origins. Both posterior communicating arteries are present, larger on the right. Bilateral PCA branches are within normal limits. Anterior circulation: Both ICA siphons are patent. Mild bilateral siphon dolichoectasia with mild calcified plaque and no stenosis. Normal ophthalmic artery origins. Patent carotid termini. Normal MCA and ACA origins. Anterior communicating artery and bilateral ACA branches are within normal limits. Left MCA M1 segment and bifurcation are mildly ectatic without stenosis. Left MCA branches are within normal limits. Right MCA M1 segment and right MCA bifurcation are patent without stenosis. Simplified right MCA branching pattern suspected compared to the left, but no right MCA branch occlusion identified and MCA M2 vessel density appears symmetric (series 10, image 17). Venous sinuses: Not evaluated due to early contrast timing Anatomic variants: None. Review of the MIP images confirms the above findings IMPRESSION: 1. Negative for large vessel occlusion or arterial stenosis in the head or neck. A simplified Right MCA branching pattern is suspected compared to the left. No Right MCA branch occlusion is identified. 2. Arterial tortuosity in the head and neck, aorta. Preliminary report of this  exam discussed by telephone with Dr. Caryl Pina on 04/11/2018 at 1131  hours. Electronically Signed   By: [place] Fleming M.D.   On: 04/11/2018 11:49   Ct Head Code Stroke Wo Contrast  Result Date: 04/11/2018 CLINICAL DATA:  Code stroke.  Left-sided weakness EXAM: CT HEAD WITHOUT CONTRAST TECHNIQUE: Contiguous axial images were obtained from the base of the skull through the vertex without intravenous contrast. COMPARISON:  None. FINDINGS: Brain: No evidence of acute infarction, hemorrhage, hydrocephalus, extra-axial collection or mass lesion/mass effect. Vascular: No hyperdense vessel or unexpected calcification. Skull: Normal. Negative for fracture or focal lesion. Sinuses/Orbits: Bilateral cataract resection Other: These results were communicated to Dr. Otelia Limes at 11:16 amon 12/12/2019by text page via the [facility] messaging system. ASPECTS [facility] Stroke Program Early CT Score) - Ganglionic level infarction (caudate, lentiform nuclei, internal capsule, insula, M1-M3 cortex): 7 - Supraganglionic infarction (M4-M6 cortex): 3 Total score (0-10 with 10 being normal): 10 IMPRESSION: No acute finding.  ASPECTS is 10. Electronically Signed   By: Marnee Spring M.D.   On: 04/11/2018 11:17    Pending Labs Unresulted Labs (From admission, onward)    Start     Ordered   04/12/18 0500  Hemoglobin A1c  Tomorrow morning,   R     04/11/18 1300   04/12/18 0500  Lipid panel  Tomorrow morning,   R    Comments:  Fasting    04/11/18 1300   04/12/18 0500  Vitamin B12  Tomorrow morning,   R     04/11/18 1348   04/11/18 1800  Troponin I - Now Then Q6H  Now then every 6 hours,   R     04/11/18 1314   04/11/18 1049  Urine rapid drug screen (hosp performed)  ONCE - STAT,   STAT     04/11/18 1049          Vitals/Pain Today's Vitals   04/11/18 1330 04/11/18 1345 04/11/18 1400 04/11/18 1415  BP: (!) 151/84 (!) 166/96 (!) 162/93 (!) 120/94  Pulse: (!) 51 (!) 59 (!) 58 (!) 52  Resp: 12 18 16 15   SpO2: 97% 95% 93% 94%  Weight:      Height:      PainSc:        Isolation  Precautions No active isolations  Medications Medications   stroke: mapping our early stages of recovery book (has no administration in time range)  acetaminophen (TYLENOL) tablet 650 mg (has no administration in time range)    Or  acetaminophen (TYLENOL) solution 650 mg (has no administration in time range)    Or  acetaminophen (TYLENOL) suppository 650 mg (has no administration in time range)  senna-docusate (Senokot-S) tablet 1 tablet (has no administration in time range)  enoxaparin (LOVENOX) injection 40 mg (has no administration in time range)  rosuvastatin (CRESTOR) tablet 20 mg (has no administration in time range)  aspirin EC tablet 81 mg (has no administration in time range)  iopamidol (ISOVUE-370) 76 % injection 100 mL (100 mLs Intravenous Contrast Given 04/11/18 1212)    Mobility walks with device Low fall risk   Focused Assessments Neuro Assessment Handoff:  Swallow screen pass? Yes    NIH Stroke Scale ( + Modified Stroke Scale Criteria)  Interval: Initial Level of Consciousness (1a.)   : Alert, keenly responsive LOC Questions (1b. )   +: Answers both questions correctly LOC Commands (1c. )   + : Performs both tasks correctly Best Gaze (2. )  +: Normal Visual (3. )  +:  No visual loss Facial Palsy (4. )    : Normal symmetrical movements Motor Arm, Left (5a. )   +: Drift Motor Arm, Right (5b. )   +: No drift Motor Leg, Left (6a. )   +: (S) Some effort against gravity(Improvement ) Motor Leg, Right (6b. )   +: No drift Limb Ataxia (7. ): Absent Sensory (8. )   +: Mild-to-moderate sensory loss, patient feels pinprick is less sharp or is dull on the affected side, or there is a loss of superficial pain with pinprick, but patient is aware of being touched Best Language (9. )   +: No aphasia Dysarthria (10. ): Normal Extinction/Inattention (11.)   +: No Abnormality Modified SS Total  +: 4 Complete NIHSS TOTAL: 5 Last date known well: 04/11/18 Last time known well:  0500 Neuro Assessment: Exceptions to WDL Neuro Checks:   Initial (04/11/18 1130)  Last Documented NIHSS Modified Score: 4 (04/11/18 1427) Has TPA been given? No If patient is a Neuro Trauma and patient is going to OR before floor call report to 4N Charge nurse: [phone] or [phone]     Recommendations: See Admitting Provider Note  Report given to:   Additional Notes: NIH of 5

## 2018-04-11 NOTE — ED Notes (Signed)
Called [facility] and cancelled Code stroke per Dr Otelia LimesLindzen

## 2018-04-11 NOTE — ED Provider Notes (Signed)
[facility] EMERGENCY DEPARTMENT Provider Note   CSN: [id number] Arrival date & time: 04/11/18  1047   An emergency department physician performed an initial assessment on this suspected stroke patient at 1048.  History   Chief Complaint Chief Complaint  Patient presents with  . Code Stroke    HPI Destiny Ruiz is a 84 y.o. female.  HPI Patient reports that she was up at 5 AM without any symptoms.  She reports that she got in the shower and started to get a frontal headache that was fairly severe.  She then felt weakness and numbness in her left arm and leg.  She reports it was hard to walk due to the weakness of her leg.  She reports she uses a walker and she did use it to get out of the shower and sit down.  She has never had similar symptoms.  No visual changes.  No nausea no vomiting.  She reports she felt well before symptoms onset.  She has not been sick recently.  No problems with headaches, sinus congestion, vomiting, abdominal pain. Past Medical History:  Diagnosis Date  . DVT (deep venous thrombosis) (HCC)   . GERD (gastroesophageal reflux disease)   . Gout   . High cholesterol   . Hypertension   . Neuropathy     There are no active problems to display for this patient.   Past Surgical History:  Procedure Laterality Date  . ABDOMINAL HYSTERECTOMY    . JOINT REPLACEMENT       OB History   No obstetric history on file.      Home Medications    Prior to Admission medications   Medication Sig Start Date End Date Taking? Authorizing Provider  acetaminophen (TYLENOL) 500 MG tablet Take 1,000 mg by mouth every 6 (six) hours as needed for mild pain or moderate pain.   Yes [provider]  amLODipine (NORVASC) 10 MG tablet Take 10 mg by mouth daily.   Yes [provider]  aspirin EC 81 MG tablet Take 81 mg by mouth daily.   Yes [provider]  atenolol (TENORMIN) 100 MG tablet Take 100 mg by mouth daily.   Yes  [provider]  cholecalciferol (VITAMIN D3) 25 MCG (1000 UT) tablet Take 1,000 Units by mouth daily.   Yes [provider]  gabapentin (NEURONTIN) 100 MG capsule Take 100 mg by mouth 3 (three) times daily.   Yes [provider]  indomethacin (INDOCIN) 50 MG capsule Take 50 mg by mouth 3 (three) times daily as needed.   Yes [provider]  isosorbide mononitrate (IMDUR) 60 MG 24 hr tablet Take 60 mg by mouth daily.   Yes [provider]  losartan (COZAAR) 100 MG tablet Take 100 mg by mouth daily.    Yes [provider]  nitroGLYCERIN (NITROSTAT) 0.4 MG SL tablet Place 0.4 mg under the tongue as needed.   Yes [provider]  omeprazole (PRILOSEC) 20 MG capsule Take 20 mg by mouth daily.   Yes [provider]  rosuvastatin (CRESTOR) 10 MG tablet Take 10 mg by mouth at bedtime.   Yes [provider]  oxyCODONE-acetaminophen (ROXICET) 5-325 MG per tablet Take 1 tablet by mouth every 6 (six) hours as needed. Patient not taking: Reported on 04/11/2018 12/26/14   Emily Filbert, MD  predniSONE (DELTASONE) 10 MG tablet Take 1 tablet (10 mg total) by mouth daily. Patient not taking: Reported on 04/11/2018 12/26/14  Emily Filbert, MD    Family History History reviewed. No pertinent family history.  Social History Social History   Tobacco Use  . Smoking status: Never Smoker  . Smokeless tobacco: Never Used  Substance Use Topics  . Alcohol use: No  . Drug use: Not on file     Allergies   Ace inhibitors and Lipitor [atorvastatin]   Review of Systems Review of Systems 10 Systems reviewed and are negative for acute change except as noted in the HPI.   Physical Exam Updated Vital Signs Ht 5\' 5"  (1.651 m)   Wt 108.9 kg   BMI 39.94 kg/m   Physical Exam Constitutional:      Appearance: She is obese. She is not ill-appearing.  HENT:     Head: Normocephalic and atraumatic.     Mouth/Throat:       Mouth: Mucous membranes are moist.     Pharynx: Oropharynx is clear.  Eyes:     Extraocular Movements: Extraocular movements intact.     Pupils: Pupils are equal, round, and reactive to light.  Cardiovascular:     Rate and Rhythm: Normal rate and regular rhythm.  Pulmonary:     Effort: Pulmonary effort is normal.     Breath sounds: Normal breath sounds.  Abdominal:     General: There is no distension.     Palpations: Abdomen is soft.     Tenderness: There is no abdominal tenderness.  Musculoskeletal:     Comments: Patient has some thickening of the ankles but not significant peripheral edema.  Calves are soft and nontender.  Skin condition of the lower legs is very good.  Skin:    General: Skin is warm and dry.  Neurological:     Mental Status: She is alert.     Comments: Patient is alert and oriented.  Speech is clear.  No evidence of a aphasia.  Patient cannot extend her left upper extremity for evaluation of pronator drift.  She can elevate it very slightly off of the stretcher.  Grip strength right is 5\5, grip strength left 3\5.  Patient can independently elevate right lower extremity off of the bed and hold against resistance.  She has difficulty elevating the left lower extremity but is able to get it off of the bed.  Dorsiflexion on right 5\5, dorsiflexion left 3\5.  Patient endorses sensory difference right lower extremity to left lower extremity for light touch.  Psychiatric:        Mood and Affect: Mood normal.      ED Treatments / Results  Labs (all labs ordered are listed, but only abnormal results are displayed) Labs Reviewed  CBC - Abnormal; Notable for the following components:      Result Value   Hemoglobin 10.8 (*)    MCH 25.3 (*)    MCHC 28.8 (*)    RDW 15.9 (*)    Platelets 144 (*)    All other components within normal limits  COMPREHENSIVE METABOLIC PANEL - Abnormal; Notable for the following components:   AST 14 (*)    All other components within  normal limits  I-STAT CHEM 8, ED - Abnormal; Notable for the following components:   Calcium, Ion 1.11 (*)    Hemoglobin 11.9 (*)    HCT 35.0 (*)    All other components within normal limits  ETHANOL  PROTIME-INR  APTT  DIFFERENTIAL  RAPID URINE DRUG SCREEN, HOSP PERFORMED  URINALYSIS, ROUTINE W REFLEX MICROSCOPIC  I-STAT TROPONIN, ED  CBG MONITORING, ED    EKG EKG Interpretation  Date/Time:  Thursday April 11 2018 11:40:38 EST Ventricular Rate:  68 PR Interval:    QRS Duration: 101 QT Interval:  442 QTC Calculation: 471 R Axis:   -5 Text Interpretation:  Sinus rhythm Low voltage, precordial leads Borderline T abnormalities, anterior leads no sig change as compared to previous Confirmed by Arby Barrette [id number]) on 04/11/2018 12:04:04 PM   Radiology Ct Angio Head W Or Wo Contrast  Result Date: 04/11/2018 CLINICAL DATA:  84 year old female code stroke with left side weakness. EXAM: CT ANGIOGRAPHY HEAD AND NECK TECHNIQUE: Multidetector CT imaging of the head and neck was performed using the standard protocol during bolus administration of intravenous contrast. Multiplanar CT image reconstructions and MIPs were obtained to evaluate the vascular anatomy. Carotid stenosis measurements (when applicable) are obtained utilizing NASCET criteria, using the distal internal carotid diameter as the denominator. CONTRAST:  75 milliliters Isovue 370. COMPARISON:  Head CT without contrast 1104 hours today. FINDINGS: CTA NECK Skeleton: Degenerative changes throughout the cervical spine. Heterogeneous vertebral so sclerosis at C4-C5 is presumably degenerative. Upper chest: Mild motion artifact. Negative lung apices. No superior mediastinal lymphadenopathy. Other neck: Retropharyngeal course of both carotid arteries (normal variant). Negative for neck mass or lymphadenopathy. Aortic arch: Ectatic aortic arch with mild to moderate soft and calcified atherosclerosis. Three vessel arch configuration.  Right carotid system: Negative brachiocephalic artery and right CCA origin. Tortuous proximal right CCA with a retropharyngeal course. Mild calcified plaque at the right ICA origin. Cervical right ICA tortuosity but no stenosis. Left carotid system: Normal left CCA origin. Tortuous left CCA with the retropharyngeal course. Mild calcified plaque at the left ICA origin. Tortuous left ICA without stenosis. Vertebral arteries: Negative proximal right subclavian artery and right vertebral artery origin. Patent right vertebral artery to the skull base without stenosis. No proximal left subclavian artery stenosis despite calcified plaque. Normal left vertebral artery origin. Tortuous left V1 segment. Tortuous left V2 segment. Patent left vertebral artery to the skull base without stenosis. CTA HEAD Posterior circulation: Codominant distal vertebral arteries with no stenosis. Normal vertebrobasilar junction. Normal right PICA origin. The left AICA may be dominant. Patent basilar artery without stenosis. Normal SCA and PCA origins. Both posterior communicating arteries are present, larger on the right. Bilateral PCA branches are within normal limits. Anterior circulation: Both ICA siphons are patent. Mild bilateral siphon dolichoectasia with mild calcified plaque and no stenosis. Normal ophthalmic artery origins. Patent carotid termini. Normal MCA and ACA origins. Anterior communicating artery and bilateral ACA branches are within normal limits. Left MCA M1 segment and bifurcation are mildly ectatic without stenosis. Left MCA branches are within normal limits. Right MCA M1 segment and right MCA bifurcation are patent without stenosis. Simplified right MCA branching pattern suspected compared to the left, but no right MCA branch occlusion identified and MCA M2 vessel density appears symmetric (series 10, image 17). Venous sinuses: Not evaluated due to early contrast timing Anatomic variants: None. Review of the MIP images  confirms the above findings IMPRESSION: 1. Negative for large vessel occlusion or arterial stenosis in the head or neck. A simplified Right MCA branching pattern is suspected compared to the left. No Right MCA branch occlusion is identified. 2. Arterial tortuosity in the head and neck, aorta. Preliminary report of this exam discussed by telephone with Dr. Caryl Pina on 04/11/2018 at 1131 hours. Electronically Signed   By: Odessa Fleming M.D.   On: 04/11/2018 11:49   Ct Angio  Neck W Or Wo Contrast  Result Date: 04/11/2018 CLINICAL DATA:  84 year old female code stroke with left side weakness. EXAM: CT ANGIOGRAPHY HEAD AND NECK TECHNIQUE: Multidetector CT imaging of the head and neck was performed using the standard protocol during bolus administration of intravenous contrast. Multiplanar CT image reconstructions and MIPs were obtained to evaluate the vascular anatomy. Carotid stenosis measurements (when applicable) are obtained utilizing NASCET criteria, using the distal internal carotid diameter as the denominator. CONTRAST:  75 milliliters Isovue 370. COMPARISON:  Head CT without contrast 1104 hours today. FINDINGS: CTA NECK Skeleton: Degenerative changes throughout the cervical spine. Heterogeneous vertebral so sclerosis at C4-C5 is presumably degenerative. Upper chest: Mild motion artifact. Negative lung apices. No superior mediastinal lymphadenopathy. Other neck: Retropharyngeal course of both carotid arteries (normal variant). Negative for neck mass or lymphadenopathy. Aortic arch: Ectatic aortic arch with mild to moderate soft and calcified atherosclerosis. Three vessel arch configuration. Right carotid system: Negative brachiocephalic artery and right CCA origin. Tortuous proximal right CCA with a retropharyngeal course. Mild calcified plaque at the right ICA origin. Cervical right ICA tortuosity but no stenosis. Left carotid system: Normal left CCA origin. Tortuous left CCA with the retropharyngeal course.  Mild calcified plaque at the left ICA origin. Tortuous left ICA without stenosis. Vertebral arteries: Negative proximal right subclavian artery and right vertebral artery origin. Patent right vertebral artery to the skull base without stenosis. No proximal left subclavian artery stenosis despite calcified plaque. Normal left vertebral artery origin. Tortuous left V1 segment. Tortuous left V2 segment. Patent left vertebral artery to the skull base without stenosis. CTA HEAD Posterior circulation: Codominant distal vertebral arteries with no stenosis. Normal vertebrobasilar junction. Normal right PICA origin. The left AICA may be dominant. Patent basilar artery without stenosis. Normal SCA and PCA origins. Both posterior communicating arteries are present, larger on the right. Bilateral PCA branches are within normal limits. Anterior circulation: Both ICA siphons are patent. Mild bilateral siphon dolichoectasia with mild calcified plaque and no stenosis. Normal ophthalmic artery origins. Patent carotid termini. Normal MCA and ACA origins. Anterior communicating artery and bilateral ACA branches are within normal limits. Left MCA M1 segment and bifurcation are mildly ectatic without stenosis. Left MCA branches are within normal limits. Right MCA M1 segment and right MCA bifurcation are patent without stenosis. Simplified right MCA branching pattern suspected compared to the left, but no right MCA branch occlusion identified and MCA M2 vessel density appears symmetric (series 10, image 17). Venous sinuses: Not evaluated due to early contrast timing Anatomic variants: None. Review of the MIP images confirms the above findings IMPRESSION: 1. Negative for large vessel occlusion or arterial stenosis in the head or neck. A simplified Right MCA branching pattern is suspected compared to the left. No Right MCA branch occlusion is identified. 2. Arterial tortuosity in the head and neck, aorta. Preliminary report of this exam  discussed by telephone with Dr. Caryl Pina on 04/11/2018 at 1131 hours. Electronically Signed   By: Odessa Fleming M.D.   On: 04/11/2018 11:49   Ct Head Code Stroke Wo Contrast  Result Date: 04/11/2018 CLINICAL DATA:  Code stroke.  Left-sided weakness EXAM: CT HEAD WITHOUT CONTRAST TECHNIQUE: Contiguous axial images were obtained from the base of the skull through the vertex without intravenous contrast. COMPARISON:  None. FINDINGS: Brain: No evidence of acute infarction, hemorrhage, hydrocephalus, extra-axial collection or mass lesion/mass effect. Vascular: No hyperdense vessel or unexpected calcification. Skull: Normal. Negative for fracture or focal lesion. Sinuses/Orbits: Bilateral cataract resection Other:  These results were communicated to Dr. Otelia LimesLindzen at 11:16 amon 12/12/2019by text page via the [facility] messaging system. ASPECTS [facility]([place] Stroke Program Early CT Score) - Ganglionic level infarction (caudate, lentiform nuclei, internal capsule, insula, M1-M3 cortex): 7 - Supraganglionic infarction (M4-M6 cortex): 3 Total score (0-10 with 10 being normal): 10 IMPRESSION: No acute finding.  ASPECTS is 10. Electronically Signed   By: Marnee SpringJonathon  Watts M.D.   On: 04/11/2018 11:17    Procedures Procedures (including critical care time)  Medications Ordered in ED Medications  iopamidol (ISOVUE-370) 76 % injection 100 mL (100 mLs Intravenous Contrast Given 04/11/18 1212)     Initial Impression / Assessment and Plan / ED Course  I have reviewed the triage vital signs and the nursing notes.  Pertinent labs & imaging results that were available during my care of the patient were reviewed by me and considered in my medical decision making (see chart for details).  Clinical Course as of Apr 11 1234  Thu Apr 11, 2018  1220 Consult: Reviewed with internal medicine teaching service for admission.   [MP]    Clinical Course User Index [MP] Arby BarrettePfeiffer, Patsye Sullivant, MD    Patient presents with left-sided weakness as  outlined.  She also had headache at that time.  Her mental status is clear.  CT scan and CT angios are not showing acute occlusive or thrombotic stroke.  Patient does continue however to have some left-sided weakness on examination.  She is alert and in no distress.  Plan will be for admission for ongoing diagnostic evaluation and management.  Final Clinical Impressions(s) / ED Diagnoses   Final diagnoses:  Left-sided weakness  Acute nonintractable headache, unspecified headache type    ED Discharge Orders    None       Arby BarrettePfeiffer, Constantine Ruddick, MD 04/11/18 1236

## 2018-04-11 NOTE — ED Notes (Signed)
Patient transported to MRI 

## 2018-04-11 NOTE — H&P (Signed)
Date: 04/11/2018               Patient Name:  Destiny Ruiz MRN: [id number]  DOB: 1931-03-18 Age / Sex: 84 y.o., female   PCP: Hillery Aldo, MD         Medical Service: Internal Medicine Teaching Service         Attending Physician: Dr. Oswaldo Done, Marquita Palms, *    First Contact: Dr. Julian Hy Pager: [phone]  Second Contact: Dr. Thornell Mule Pager: [phone]       After Hours (After 5p/  First Contact Pager: [phone]  weekends / holidays): Second Contact Pager: [phone]   Chief Complaint: left sided weakness, pain, numbness  History of Present Illness: 84 year old female with past medical history of hypertension, hyperlipidemia, neuropathy, DVT, chest pain.  She tells me she was going to the bathroom this morning at about 5 AM to take a shower.  She was using her walker as she always does when she acutely had an episode of left-sided weakness, headache chest pain.  Her daughter was able to sit her down, gave her 325 mg of aspirin and called 911.  She reports she does not usually have headaches.  She denied any vision changes, nausea or vomiting.  She described her chest pain as a substernal chest pressure that lasted for about an hour but she was not diaphoretic.    ED course: a code stroke was called, vitals were stable, CT head and angio were negative for abnormalities.  Her ECG showed sinus rhythm.  Her headache resolved but weakness persisted.    Meds:  Current Meds  Medication Sig  . acetaminophen (TYLENOL) 500 MG tablet Take 1,000 mg by mouth every 6 (six) hours as needed for mild pain or moderate pain.  Marland Kitchen amLODipine (NORVASC) 10 MG tablet Take 10 mg by mouth daily.  Marland Kitchen aspirin EC 81 MG tablet Take 81 mg by mouth daily.  Marland Kitchen atenolol (TENORMIN) 100 MG tablet Take 100 mg by mouth daily.  . cholecalciferol (VITAMIN D3) 25 MCG (1000 UT) tablet Take 1,000 Units by mouth daily.  Marland Kitchen gabapentin (NEURONTIN) 100 MG capsule Take 100 mg by mouth 3 (three) times daily.  .  indomethacin (INDOCIN) 50 MG capsule Take 50 mg by mouth 3 (three) times daily as needed.  . isosorbide mononitrate (IMDUR) 60 MG 24 hr tablet Take 60 mg by mouth daily.  Marland Kitchen losartan (COZAAR) 100 MG tablet Take 100 mg by mouth daily.   . nitroGLYCERIN (NITROSTAT) 0.4 MG SL tablet Place 0.4 mg under the tongue as needed.  Marland Kitchen omeprazole (PRILOSEC) 20 MG capsule Take 20 mg by mouth daily.  . rosuvastatin (CRESTOR) 10 MG tablet Take 10 mg by mouth at bedtime.     Allergies: Allergies as of 04/11/2018 - Review Complete 04/11/2018  Allergen Reaction Noted  . Ace inhibitors Other (See Comments) 12/26/2014  . Lipitor [atorvastatin] Other (See Comments) 12/26/2014   Past Medical History:  Diagnosis Date  . DVT (deep venous thrombosis) (HCC)   . GERD (gastroesophageal reflux disease)   . Gout   . High cholesterol   . Hypertension   . Neuropathy     Family History:   HTN mother, father HLD mother, father  Social History:  Social History   Socioeconomic History  . Marital status: Widowed    Spouse name: Not on file  . Number of children: Not on file  . Years of education: Not on file  . Highest education level:  Not on file  Occupational History  . Not on file  Social Needs  . Financial resource strain: Not on file  . Food insecurity:    Worry: Not on file    Inability: Not on file  . Transportation needs:    Medical: Not on file    Non-medical: Not on file  Tobacco Use  . Smoking status: Never Smoker  . Smokeless tobacco: Never Used  Substance and Sexual Activity  . Alcohol use: No  . Drug use: Not on file  . Sexual activity: Not on file  Lifestyle  . Physical activity:    Days per week: Not on file    Minutes per session: Not on file  . Stress: Not on file  Relationships  . Social connections:    Talks on phone: Not on file    Gets together: Not on file    Attends religious service: Not on file    Active member of club or organization: Not on file    Attends  meetings of clubs or organizations: Not on file    Relationship status: Not on file  . Intimate partner violence:    Fear of current or ex partner: Not on file    Emotionally abused: Not on file    Physically abused: Not on file    Forced sexual activity: Not on file  Other Topics Concern  . Not on file  Social History Narrative  . Not on file    Review of Systems: A complete ROS was negative except as per HPI.   Physical Exam: Blood pressure (!) 161/95, pulse (!) 59, resp. rate 13, height 5\' 5"  (1.651 m), weight 108.9 kg, SpO2 94 %. Physical Exam Constitutional:      General: She is not in acute distress.    Appearance: She is obese. She is not diaphoretic.  HENT:     Head: Normocephalic and atraumatic.  Eyes:     General: No scleral icterus.       Right eye: No discharge.        Left eye: No discharge.  Cardiovascular:     Rate and Rhythm: Regular rhythm. Bradycardia present.     Heart sounds: Normal heart sounds. No murmur. No friction rub. No gallop.   Pulmonary:     Effort: Pulmonary effort is normal. No respiratory distress.     Breath sounds: No wheezing.  Chest:     Chest wall: No tenderness.  Abdominal:     General: Bowel sounds are normal. There is no distension.     Palpations: Abdomen is soft. There is no mass.     Tenderness: There is no abdominal tenderness. There is no guarding or rebound.  Musculoskeletal:     Right lower leg: Edema (2+) present.     Left lower leg: Edema (2+) present.  Neurological:     Mental Status: She is alert.     Comments: II: Visual fields intact;  PERRL. III,IV, AV:WUJWJX not present,EOMI without nystagmus V,VII:No facial droop. Facial sensation equalbilaterally VIII: hearingintact to voice IX,X:Palaterises symmetrically BJ:[id number] shrug XII: midline tongue extension Motor: RUE and RLE 5/5 LUE Her movement is delayed and less coordinated than the right.  With effort she can move against gravity but  has difficulty with very little resistance.  Poor fine motor ability of left hand.   LLE Again very slow to move leg but is able to move slowly against gravity but not light resistance Sensory:light touch slight decrease in L compared  to R Deep Tendon Reflexes: Unable to assess Cerebellar:No right FNF dysmetria, could not assess left Gait:Deferred    Psychiatric:        Mood and Affect: Mood normal.     EKG: personally reviewed my interpretation is sinus rhythm, non specific T abnormalities, probable Left atrial enlargement  CXR: personally reviewed my interpretation is none available  Assessment & Plan by Problem: Active Problems:   Acute left-sided weakness  Acute left sided weakness: CVA vs TIA vs less likely somatic symptom disorder or malingering.  CT angio and head neg for etiology.    -MRI brain -TTE -continue ASA 81mg  -increase crestor to 20mg  daily -PT/OT/SLP -permissive HTN  Chest Pain: patient denies history of higher MI, she is on sublingual nitro and Imdur at home.  There is no cath report available in epic she is not sure if she has blockages but she denies having any kind of stents or interventions.    -Follow troponins -repeat EKG in the morning -Echocardiogram  HTN: she reports good control on home bp meds  -hold for permissive HTN   Dispo: Admit patient to Inpatient with expected length of stay greater than 2 midnights.  Signed: Angelita InglesWinfrey, Marijo Quizon B, MD 04/11/2018, 1:10 PM

## 2018-04-11 NOTE — Consult Note (Signed)
Referring Physician: Dr. Donnald Garre    Chief Complaint: Acute onset of left sided weakness  HPI: Destiny Ruiz is an 84 y.o. female who presents to the ED via EMS for acute onset of LUE and LLE weakness in addition to 10/10 bifrontal headache. The headache began first and then while in the shower she noticed that her left side was weak. LKN 0500.   She denies a prior history of stroke. She takes ASA at home and medications also include antihypertensives and a statin.   Denies confusion, vision changes, speech deficit, abdominal pain or right sided symptoms. Has mild CP.  LSN: 0500 tPA Given: No: Out of time window  Past Medical History:  Diagnosis Date  . DVT (deep venous thrombosis) (HCC)   . GERD (gastroesophageal reflux disease)   . Gout   . High cholesterol   . Hypertension   . Neuropathy [facility])     Past Surgical History:  Procedure Laterality Date  . ABDOMINAL HYSTERECTOMY    . JOINT REPLACEMENT      No family history on file. Social History:  reports that she has never smoked. She has never used smokeless tobacco. She reports that she does not drink alcohol. No history on file for drug.  Allergies:  Allergies  Allergen Reactions  . Ace Inhibitors Other (See Comments)    Cramping   . Lipitor [Atorvastatin] Other (See Comments)    Cramping     Medications: Home medications reviewed. Full list by Pharmacy to be input into Epic.   ROS: As per HPI.   Physical Examination: There were no vitals taken for this visit.  General: Morbidly obese HEENT: [place]/AT Lungs: Respirations unlabored Ext: Warm and well perfused  Neurologic Examination: Mental Status: Alert, fully oriented, anxious affect.  Speech fluent with intact naming and comprehension.  Able to follow all commands without difficulty. No dysarthria Cranial Nerves: II:  Visual fields intact. PERRL.  III,IV, VI: EOMI without nystagmus. No ptosis V,VII: No facial droop. Facial sensation to temp decreased on  the left VIII: hearing intact to voice IX,X: No hypophonia or hoarseness XI: Head midline XII: midline tongue extension  Motor: Tone and bulk normal x 4 RUE and RLE 5/5 proximal and distal LUE: Will not grip examiners hand despite normal tone of finger flexors. Initially would not lift LUE. When distracted and then passively raised by examiner, the LUE remains elevated for several seconds. Will not resist with biceps or triceps to command.  LLE: Will not lift antigravity but resists with knee extension 4+/5. Will withdraw LLE to command with 4/5 strength and inconsistent effort.  Sensory: Decreased sensation to LUE and LLE with intermittent extinction.  Deep Tendon Reflexes:  Difficult to elicit due to adiposity and patient not relaxing limbs.  Plantars: Equivocal bilaterally Cerebellar: No ataxia with FNF on right. Unable to perform on left.  Gait: Deferred  NIHSS: 5  Results for orders placed or performed during the hospital encounter of 04/11/18 (from the past 48 hour(s))  CBG monitoring, ED     Status: None   Collection Time: 04/11/18 10:51 AM  Result Value Ref Range   Glucose-Capillary 75 70 - 99 mg/dL  CBC     Status: Abnormal   Collection Time: 04/11/18 10:55 AM  Result Value Ref Range   WBC 6.4 4.0 - 10.5 K/uL   RBC 4.27 3.87 - 5.11 MIL/uL   Hemoglobin 10.8 (L) 12.0 - 15.0 g/dL   HCT 16.1 09.6 - 04.5 %   MCV 87.8  80.0 - 100.0 fL   MCH 25.3 (L) 26.0 - 34.0 pg   MCHC 28.8 (L) 30.0 - 36.0 g/dL   RDW 09.815.9 (H) 11.911.5 - 14.715.5 %   Platelets 144 (L) 150 - 400 K/uL   nRBC 0.0 0.0 - 0.2 %    Comment: Performed at [facility], [address] [place], [place] [address]  Differential     Status: None   Collection Time: 04/11/18 10:55 AM  Result Value Ref Range   Neutrophils Relative % 50 %   Neutro Abs 3.2 1.7 - 7.7 K/uL   Lymphocytes Relative 36 %   Lymphs Abs 2.3 0.7 - 4.0 K/uL   Monocytes Relative 9 %   Monocytes Absolute 0.6 0.1 - 1.0 K/uL   Eosinophils Relative 4 %    Eosinophils Absolute 0.2 0.0 - 0.5 K/uL   Basophils Relative 1 %   Basophils Absolute 0.0 0.0 - 0.1 K/uL   Immature Granulocytes 0 %   Abs Immature Granulocytes 0.02 0.00 - 0.07 K/uL    Comment: Performed at [facility], [address] [place], [place] [address]  I-stat troponin, ED     Status: None   Collection Time: 04/11/18 10:56 AM  Result Value Ref Range   Troponin i, poc 0.00 0.00 - 0.08 ng/mL   Comment 3            Comment: Due to the release kinetics of cTnI, a negative result within the first hours of the onset of symptoms does not rule out myocardial infarction with certainty. If myocardial infarction is still suspected, repeat the test at appropriate intervals.   I-Stat Chem 8, ED     Status: Abnormal   Collection Time: 04/11/18 10:59 AM  Result Value Ref Range   Sodium 144 135 - 145 mmol/L   Potassium 4.0 3.5 - 5.1 mmol/L   Chloride 107 98 - 111 mmol/L   BUN 16 8 - 23 mg/dL   Creatinine, Ser 6.570.90 0.44 - 1.00 mg/dL   Glucose, Bld 87 70 - 99 mg/dL   Calcium, Ion 8.461.11 (L) 1.15 - 1.40 mmol/L   TCO2 29 22 - 32 mmol/L   Hemoglobin 11.9 (L) 12.0 - 15.0 g/dL   HCT 96.235.0 (L) 95.236.0 - 84.146.0 %   No results found.  Assessment: 84 y.o. female presenting with acute onset of left sided weakness and 10/10 headache 1. Inconsistencies/functionality on examination suggest possible somatoform disorder versus factitious/malingering for psychosocial or other secondary gain. Stroke possible but felt to be unlikely. Will need MRI brain to assess 2. CT head with no acute abnormality.  3. Stroke Risk Factors - DVT, HTN, hypercholesterolemia, morbid obesity 4. Out of the IV tPA time window 5. CTA shows no LVO on preliminary read by Neuroradiology. Not an endovascular candidate.   Plan: 1. HgbA1c, fasting lipid panel 2. MRI of the brain without contrast 3. PT consult, OT consult, Speech consult 4. TTE 5. Frequent neuro checks 6. Prophylactic therapy- Continue ASA 7. Risk factor  modification 8. Telemetry monitoring 9. Given advanced age, benefits of statin are most likely outweighed by risks 10. Permissive HTN x 24 hours   @Electronically  signed: Dr. Caryl PinaEric Theodoro Koval@  04/11/2018, 11:09 AM

## 2018-04-11 NOTE — ED Triage Notes (Addendum)
Pt arrives to ED from home with a Code Stroke called from [facility]. EMS reports pt had acute onset of LUE and LLE weakness in addition to 10/10 bifrontal headache. Pt's headache started first while in the shower she noticed that her left side was weak. LKN 0500.

## 2018-04-12 ENCOUNTER — Inpatient Hospital Stay (HOSPITAL_BASED_OUTPATIENT_CLINIC_OR_DEPARTMENT_OTHER): Payer: Medicare Other

## 2018-04-12 DIAGNOSIS — Z79899 Other long term (current) drug therapy: Secondary | ICD-10-CM | POA: Diagnosis not present

## 2018-04-12 DIAGNOSIS — G459 Transient cerebral ischemic attack, unspecified: Secondary | ICD-10-CM

## 2018-04-12 DIAGNOSIS — I10 Essential (primary) hypertension: Secondary | ICD-10-CM | POA: Diagnosis not present

## 2018-04-12 DIAGNOSIS — Z7982 Long term (current) use of aspirin: Secondary | ICD-10-CM | POA: Diagnosis not present

## 2018-04-12 LAB — PHOSPHORUS: Phosphorus: 4.1 mg/dL (ref 2.5–4.6)

## 2018-04-12 LAB — LIPID PANEL
Cholesterol: 103 mg/dL (ref 0–200)
HDL: 30 mg/dL — AB (ref >40)
LDL Cholesterol: 45 mg/dL (ref 0–99)
TRIGLYCERIDES: 140 mg/dL (ref <150)
Total CHOL/HDL Ratio: 3.4 RATIO
VLDL: 28 mg/dL (ref 0–40)

## 2018-04-12 LAB — TROPONIN I: Troponin I: <0.03 ng/mL (ref <0.03)

## 2018-04-12 LAB — ECHOCARDIOGRAM COMPLETE
Height: 65 in
Weight: 3840 oz

## 2018-04-12 LAB — VITAMIN B12: VITAMIN B 12: 319 pg/mL (ref 180–914)

## 2018-04-12 LAB — HEMOGLOBIN A1C
Hgb A1c MFr Bld: 5.9 % — ABNORMAL HIGH (ref 4.8–5.6)
Mean Plasma Glucose: 122.63 mg/dL

## 2018-04-12 LAB — MAGNESIUM: Magnesium: 2 mg/dL (ref 1.7–2.4)

## 2018-04-12 MED ORDER — ROSUVASTATIN CALCIUM 20 MG PO TABS: 20.0000 mg | ORAL_TABLET | Freq: Every day | ORAL | 0 refills | Status: DC

## 2018-04-12 NOTE — Evaluation (Signed)
Occupational Therapy Evaluation Patient Details Name: Destiny SpareBlanche G Pritts MRN: [id number] DOB: [birth year] Today's Date: 04/12/2018    History of Present Illness  pt is an 84 y/o female with pmh significant for HTN, neuropathy, DVT, CP, admitted with left sided weakness, pain and numbness.  CT/MRI negative for acute abnormalities.   Clinical Impression   This 84 yo female admitted with above presents to acute OT with decreased strength of LUE and LLE affecting independence with basic ADLs. She will benefit from acute OT with follow up HHOT and intermittent prn A from family (which pt says is available). We will continue to follow.    Follow Up Recommendations  Home health OT;Supervision - Intermittent    Equipment Recommendations  None recommended by OT       Precautions / Restrictions Precautions Precautions: Fall Restrictions Weight Bearing Restrictions: No      Mobility Bed Mobility Overal bed mobility: Needs Assistance Bed Mobility: Supine to Sit     Supine to sit: Supervision        Transfers Overall transfer level: Needs assistance Equipment used: Rolling walker (2 wheeled) Transfers: Sit to/from Stand Sit to Stand: Supervision              Balance Overall balance assessment: Needs assistance Sitting-balance support: No upper extremity supported;Feet supported Sitting balance-Leahy Scale: Good     Standing balance support: No upper extremity supported;During functional activity;Single extremity supported Standing balance-Leahy Scale: Fair Standing balance comment: standing at sink to groom; single UE standing for peri care                           ADL either performed or assessed with clinical judgement   ADL Overall ADL's : Needs assistance/impaired Eating/Feeding: Independent;Sitting   Grooming: Wash/dry hands;Supervision/safety;Standing   Upper Body Bathing: Supervision/ safety;Set up;Sitting   Lower Body Bathing: Supervison/  safety;Set up;Sit to/from stand   Upper Body Dressing : Set up;Supervision/safety;Sitting   Lower Body Dressing: Set up;Supervision/safety;Sit to/from stand   Toilet Transfer: Supervision/safety;Ambulation;RW;BSC Toilet Transfer Details (indicate cue type and reason): over toilet Toileting- Clothing Manipulation and Hygiene: Supervision/safety;Set up;Sit to/from stand               Vision Patient Visual Report: No change from baseline              Pertinent Vitals/Pain Pain Assessment: Faces Faces Pain Scale: Hurts a little bit Pain Location: L shoulder Pain Descriptors / Indicators: Sore Pain Intervention(s): Monitored during session     Hand Dominance Left   Extremity/Trunk Assessment Upper Extremity Assessment Upper Extremity Assessment: LUE deficits/detail LUE Deficits / Details: Decreased shoulder and elbow ROM due normal; forearm and distally good LUE Coordination: decreased gross motor           Communication Communication Communication: No difficulties   Cognition Arousal/Alertness: Awake/alert Behavior During Therapy: WFL for tasks assessed/performed Overall Cognitive Status: Within Functional Limits for tasks assessed                                                Home Living Family/patient expects to be discharged to:: Private residence Living Arrangements: Alone   Type of Home: (senior living apartment) Home Access: Level entry     Home Layout: One level     Bathroom Shower/Tub: Walk-in shower;Door   [employer] Toilet: Standard  Home Equipment: Shower seat - built in;Walker - 4 wheels;Walker - 2 wheels;Toilet riser          Prior Functioning/Environment Level of Independence: Independent                 OT Problem List: Decreased strength;Decreased range of motion;Pain;Impaired balance (sitting and/or standing)      OT Treatment/Interventions: Self-care/ADL training;Therapeutic exercise;Therapeutic  activities;Patient/family education    OT Goals(Current goals can be found in the care plan section) Acute Rehab OT Goals Patient Stated Goal: To get back home OT Goal Formulation: With patient Time For Goal Achievement: 04/26/18 Potential to Achieve Goals: Good  OT Frequency: Min 2X/week           Co-evaluation PT/OT/SLP Co-Evaluation/Treatment: Yes(partial) Reason for Co-Treatment: For patient/therapist safety   OT goals addressed during session: ADL's and self-care;Strengthening/ROM      AM-PAC OT "6 Clicks" Daily Activity     Outcome Measure Help from another person eating meals?: None Help from another person taking care of personal grooming?: A Little Help from another person toileting, which includes using toliet, bedpan, or urinal?: A Little Help from another person bathing (including washing, rinsing, drying)?: A Little Help from another person to put on and taking off regular upper body clothing?: A Little Help from another person to put on and taking off regular lower body clothing?: A Little 6 Click Score: 19   End of Session Equipment Utilized During Treatment: Gait belt;Rolling walker  Activity Tolerance: Patient tolerated treatment well Patient left: in bed(getting ready to be taken down for ECHO)  OT Visit Diagnosis: Other abnormalities of gait and mobility (R26.89);Hemiplegia and hemiparesis Hemiplegia - Right/Left: Left Hemiplegia - dominant/non-dominant: Dominant Hemiplegia - caused by: Unspecified                Time: 1610-9604 OT Time Calculation (min): 24 min Charges:  OT General Charges $OT Visit: 1 Visit OT Evaluation $OT Eval Moderate Complexity: 1 Mod  Ignacia Palma, OTR/L Acute [employer] Pager [phone] Office [phone]     Evette Georges 04/12/2018, 11:08 AM

## 2018-04-12 NOTE — Evaluation (Signed)
Physical Therapy Evaluation Patient Details Name: Destiny Ruiz MRN: [id number] DOB: [birth year] Today's Date: 04/12/2018   History of Present Illness   pt is an 84 y/o female with pmh significant for HTN, neuropathy, DVT, CP, admitted with left sided weakness, pain and numbness.  CT/MRI negative for acute abnormalities.  Clinical Impression  Pt admitted with/for left sided weakness.  Pt presently at supervision level.  Pt currently limited functionally due to the problems listed below.  (see problems list.)  Pt will benefit from PT to maximize function and safety to be able to get home safely with available assist.      Follow Up Recommendations Home health PT    Equipment Recommendations  None recommended by PT    Recommendations for Other Services       Precautions / Restrictions Precautions Precautions: Fall Restrictions Weight Bearing Restrictions: No      Mobility  Bed Mobility Overal bed mobility: Needs Assistance Bed Mobility: Supine to Sit     Supine to sit: Supervision        Transfers Overall transfer level: Needs assistance Equipment used: Rolling walker (2 wheeled) Transfers: Sit to/from Stand Sit to Stand: Supervision         General transfer comment: used safe transfer technique  Ambulation/Gait Ambulation/Gait assistance: Supervision Gait Distance (Feet): 75 Feet Assistive device: Rolling walker (2 wheeled) Gait Pattern/deviations: Step-through pattern Gait velocity: slower Gait velocity interpretation: <1.8 ft/sec, indicate of risk for recurrent falls General Gait Details: generally steady and a little guarded with an unfamiliar RW.  Pt generally uses a rollator.  Stairs            Wheelchair Mobility    Modified Rankin (Stroke Patients Only) Modified Rankin (Stroke Patients Only) Pre-Morbid Rankin Score: No symptoms Modified Rankin: Moderate disability     Balance Overall balance assessment: Needs  assistance Sitting-balance support: No upper extremity supported;Feet supported Sitting balance-Leahy Scale: Good     Standing balance support: No upper extremity supported;During functional activity;Single extremity supported Standing balance-Leahy Scale: Fair Standing balance comment: standing at sink to groom; single UE standing for peri care                             Pertinent Vitals/Pain Pain Assessment: No/denies pain Faces Pain Scale: Hurts a little bit Pain Location: L shoulder Pain Descriptors / Indicators: Sore Pain Intervention(s): Monitored during session    Home Living Family/patient expects to be discharged to:: Private residence Living Arrangements: Alone Available Help at Discharge: Family;Available PRN/intermittently Type of Home: Apartment Home Access: Level entry     Home Layout: One level Home Equipment: Shower seat - built in;Walker - 4 wheels;Walker - 2 wheels;Toilet riser      Prior Function Level of Independence: Independent               Hand Dominance   Dominant Hand: Left    Extremity/Trunk Assessment   Upper Extremity Assessment Upper Extremity Assessment: Defer to OT evaluation LUE Deficits / Details: Decreased shoulder and elbow ROM due normal; forearm and distally good LUE Coordination: decreased gross motor    Lower Extremity Assessment Lower Extremity Assessment: LLE deficits/detail LLE Deficits / Details: mildly more weak than right LE LLE Sensation: history of peripheral neuropathy       Communication   Communication: No difficulties  Cognition Arousal/Alertness: Awake/alert Behavior During Therapy: WFL for tasks assessed/performed Overall Cognitive Status: Within Functional Limits for tasks assessed  General Comments      Exercises     Assessment/Plan    PT Assessment Patient needs continued PT services  PT Problem List Decreased  strength;Decreased activity tolerance;Decreased balance;Decreased mobility       PT Treatment Interventions Gait training;Functional mobility training;Therapeutic activities;Patient/family education    PT Goals (Current goals can be found in the Care Plan section)  Acute Rehab PT Goals Patient Stated Goal: To get back home PT Goal Formulation: With patient Time For Goal Achievement: 04/19/18 Potential to Achieve Goals: Good    Frequency Min 3X/week   Barriers to discharge        Co-evaluation PT/OT/SLP Co-Evaluation/Treatment: Yes Reason for Co-Treatment: To address functional/ADL transfers PT goals addressed during session: Mobility/safety with mobility OT goals addressed during session: ADL's and self-care;Strengthening/ROM       AM-PAC PT "6 Clicks" Mobility  Outcome Measure Help needed turning from your back to your side while in a flat bed without using bedrails?: None Help needed moving from lying on your back to sitting on the side of a flat bed without using bedrails?: None Help needed moving to and from a bed to a chair (including a wheelchair)?: None Help needed standing up from a chair using your arms (e.g., wheelchair or bedside chair)?: None Help needed to walk in hospital room?: None Help needed climbing 3-5 steps with a railing? : A Little 6 Click Score: 23    End of Session   Activity Tolerance: Patient tolerated treatment well Patient left: in bed;Other (comment)(going to a test) Nurse Communication: Mobility status PT Visit Diagnosis: Difficulty in walking, not elsewhere classified (R26.2);Other symptoms and signs involving the nervous system (R29.898)    Time: [id number] PT Time Calculation (min) (ACUTE ONLY): 33 min   Charges:   PT Evaluation $PT Eval Low Complexity: 1 Low          04/12/2018  [place] BingKen Chrishana Spargur, PT Acute Rehabilitation Services [phone]  (pager) [phone]  (office)  Destiny GumKenneth V Jadrian Ruiz 04/12/2018, 11:51 AM

## 2018-04-12 NOTE — Care Management Obs Status (Signed)
MEDICARE OBSERVATION STATUS NOTIFICATION   Patient Details  Name: Destiny Ruiz MRN: [id number] Date of Birth: 1931/03/27   Medicare Observation Status Notification Given:  Yes    Cherylann ParrClaxton, Parker Sawatzky S, RN 04/12/2018, 1:18 PM

## 2018-04-12 NOTE — Progress Notes (Signed)
  Echocardiogram 2D Echocardiogram has been performed.  Destiny Ruiz 04/12/2018, 11:44 AM

## 2018-04-12 NOTE — Care Management CC44 (Signed)
Condition Code 44 Documentation Completed  Patient Details  Name: Destiny Ruiz MRN: [id number] Date of Birth: 04/26/1931   Condition Code 44 given:  Yes Patient signature on Condition Code 44 notice:  Yes Documentation of 2 MD's agreement:  Yes Code 44 added to claim:  Yes    Cherylann ParrClaxton, Bunnie Rehberg S, RN 04/12/2018, 1:18 PM

## 2018-04-12 NOTE — Progress Notes (Signed)
Pt discharge education and instructions completed with pt and granddaughter at bedside; both voices understanding and denies any questions. Pt IV and telemetry removed; pt to pick up electronically sent prescription from preferred pharmacy on file. MD paged concerning pt Prednisone and Dr. Frances FurbishWinfrey said he will address. Pt discharge home with daughter to transport her home. Pt transported off unit via wheelchair with belongings and family to the side. Dionne BucyP. Amo Jodeci Roarty RN

## 2018-04-12 NOTE — Care Management Note (Addendum)
Case Management Note  Patient Details  Name: Destiny Ruiz MRN: [id number] Date of Birth: Jun 04, 1930  Subjective/Objective: Pt admitted with stroke                   Action/Plan:  PTA completely independent - pt stays in subsidized housing.  Pt informed CM that she has a walker that she uses for mobility assistance in the home.  Pt has PCP and active insurance.  CM will continue to follow    Expected Discharge Date:  04/12/18               Expected Discharge Plan:  Home w Home Health Services  In-House Referral:     Discharge planning Services  CM Consult  Post Acute Care Choice:    Choice offered to:  Patient  DME Arranged:    DME Agency:   HH Arranged:  PT, OT HH Agency:   [facility]  Status of Service:  Completed, signed off  If discussed at Long Length of Stay Meetings, dates discussed:    Additional Comments: 04/12/2018 Pt deemed stable to discharge home via private vehicle.  CM gave pt medicare.gov HH list and placed a copy in the shadow chart.  [facility] agency contacted and referral accepted. Cherylann ParrClaxton, Yuvia Plant S, RN 04/12/2018, 1:21 PM

## 2018-04-12 NOTE — Discharge Summary (Signed)
Name: Destiny Ruiz MRN: [id number] DOB: [birth year] 84 y.o. PCP: Hillery Aldo, MD  Date of Admission: 04/11/2018 10:48 AM Date of Discharge: 04/12/2018 Attending Physician: Tyson Alias, *  Discharge Diagnosis: 1.  Transient ischemic attack 2.  Hypertension 3.  Hyperlipidemia  Discharge Medications: Allergies as of 04/12/2018      Reactions   Ace Inhibitors Other (See Comments)   Cramping   Lipitor [atorvastatin] Other (See Comments)   Cramping      Medication List    TAKE these medications   acetaminophen 500 MG tablet Commonly known as:  TYLENOL Take 1,000 mg by mouth every 6 (six) hours as needed for mild pain or moderate pain.   amLODipine 10 MG tablet Commonly known as:  NORVASC Take 10 mg by mouth daily.   aspirin EC 81 MG tablet Take 81 mg by mouth daily.   atenolol 100 MG tablet Commonly known as:  TENORMIN Take 100 mg by mouth daily.   cholecalciferol 25 MCG (1000 UT) tablet Commonly known as:  VITAMIN D3 Take 1,000 Units by mouth daily.   gabapentin 100 MG capsule Commonly known as:  NEURONTIN Take 100 mg by mouth 3 (three) times daily.   indomethacin 50 MG capsule Commonly known as:  INDOCIN Take 50 mg by mouth 3 (three) times daily as needed.   isosorbide mononitrate 60 MG 24 hr tablet Commonly known as:  IMDUR Take 60 mg by mouth daily.   losartan 100 MG tablet Commonly known as:  COZAAR Take 100 mg by mouth daily.   nitroGLYCERIN 0.4 MG SL tablet Commonly known as:  NITROSTAT Place 0.4 mg under the tongue as needed.   omeprazole 20 MG capsule Commonly known as:  PRILOSEC Take 20 mg by mouth daily.   oxyCODONE-acetaminophen 5-325 MG tablet Commonly known as:  ROXICET Take 1 tablet by mouth every 6 (six) hours as needed.   predniSONE 10 MG tablet Commonly known as:  DELTASONE Take 1 tablet (10 mg total) by mouth daily.   rosuvastatin 20 MG tablet Commonly known as:  CRESTOR Take 1 tablet (20 mg total) by mouth  daily at 6 PM. What changed:    medication strength  how much to take  when to take this       Disposition and follow-up:   Destiny Ruiz was discharged from [facility] in Stable condition.  At the hospital follow up visit please address:  1.  Please follow-up her functional status and ensure that she is getting home physical therapy.  2.  Labs / imaging needed at time of follow-up: None  3.  Pending labs/ test needing follow-up: None  Follow-up Appointments: Follow-up Information    Hillery Aldo, MD Follow up.   Specialty:  Family Medicine Why:  Monday April 22, 2018 at 10:20 AM. If this does not work for you, please call their office to reschedule. Contact information: [address] [place] [address] [phone]           Hospital Course by problem list: 1.  Transient ischemic attack: Destiny Ruiz presented with left-sided weakness, headache, sensory deficits, and chest pain.  Her chest pain was unlikely to be cardiac in origin given normal EKG and negative troponins.  She was admitted for stroke work-up.  Head CT was found to be unrevealing and MRI showed chronic microvascular changes but no acute ischemic event.  CT angio was negative for etiology.  She was medically managed with Crestor 20 mg  and aspirin 81 mg daily.  Her symptoms significantly improved she was left with minimal left upper and lower extremity weakness.  She was evaluated by both physical therapy and Occupational Therapy who recommended home PT/OT (ordered accordingly).  She was discharged home in stable condition and will need to continue Crestor 20 mg daily and aspirin 81 mg daily indefinitely.  2.  Hypertension: Home medications include amlodipine, atenolol, Imdur, and losartan.  These medications were held during admission to allow for permissive hypertension.  Her blood pressure remained within acceptable limits.  She will be discharged on these  medications.  3.  Hyperlipidemia: Home medication includes rosuvastatin 10 mg daily.  This medication was increased to a 20 mg daily to decrease the risk of future stroke.  Discharge Vitals:   BP (!) 150/82 (BP Location: Left Arm)   Pulse (!) 53   Temp 98.7 F (37.1 C) (Oral)   Resp 18   Ht 5\' 5"  (1.651 m)   Wt 108.9 kg   SpO2 97%   BMI 39.94 kg/m   Pertinent Labs, Studies, and Procedures:  12/12 CT: No acute finding.  12/12 CTA:  IMPRESSION: 1. Negative for large vessel occlusion or arterial stenosis in then head or neck. A simplified Right MCA branching pattern is suspected compared to the left. No Right MCA branch occlusion is identified. 2. Arterial tortuosity in the head and neck, aorta.  12/12 MRI:  IMPRESSION: Chronic microvascular ischemic changes of the white matter without acute intracranial abnormality.  12/12 CXR: No acute cardiopulmonary abnormality.  Discharge Instructions: Discharge Instructions    Diet - low sodium heart healthy   Complete by:  As directed    Discharge instructions   Complete by:  As directed    Thank you so much for allowing us to care for you during your admission.  Your symptoms were likely due to a transient ischemic attack also known as a mini stroke.  It is very reassuring that your weakness has significantly improved since yesterday.  I have put in an order for home health physical therapy so that you can continue to work towards improving her strength.   Increase activity slowly   Complete by:  As directed       Signed: Synetta ShadowPrince, Giovonni Poirier M, MD 04/12/2018, 12:38 PM   Pager: [phone]

## 2018-04-12 NOTE — Progress Notes (Signed)
   Subjective:   [name] reports that she is doing well this morning. Denies any new weakness, diplopia, headache, chest pain. She is tolerating oral intake with no abnormalities. She was very encouraged by the results of of MRI, CT Angiography. She has assistance at home from daughter, grandchildren and nurse aid.   Objective:  Vital signs in last 24 hours: Vitals:   04/11/18 1825 04/11/18 1939 04/11/18 2312 04/12/18 0324  BP: (!) 168/90 (!) 152/67 (!) 144/72 (!) 146/80  Pulse: (!) 56 61 62 (!) 54  Resp: 18 18 18 18   Temp: 98.5 F (36.9 C) 97.8 F (36.6 C) 98.6 F (37 C) 98.2 F (36.8 C)  TempSrc: Oral Oral Oral Oral  SpO2: 97% 100% 98% 97%  Weight:      Height:       Const: In NAD, lying comfortably in bed  Neuro:  4/5 motor strength in left upper and lower extremities 5/5 motor strength in right upper and lower extremities Intact sensation in upper and lower extremities bilaterally  Assessment/Plan:  Active Problems:   Acute left-sided weakness   Hypertension  [name] presented with left-sided weakness concerning for TIA.  CT angio negative for etiology.  MRI shows chronic microvascular changes but no acute ischemic event.  She is being medically managed with Crestor 20 mg and aspirin 81 mg daily.  Her symptoms have significantly improved but continues to have some left upper and lower extremity weakness.  Echo scheduled for today.  Will also follow-up OT and PT recommendations.  TIA - Follow-up TTE results - OT, PT, ST to evaluate patient - Continue Crestor 20 mg daily and aspirin 81 mg daily - Allow for permissive hypertension  Chest pain: Resolved.  Repeat EKG showed sinus bradycardia.  Troponins have remained negative.  Dispo: Anticipated discharge in 0-1 days.  Synetta ShadowPrince, Grainger Mccarley M, MD 04/12/2018, 6:40 AM Pager: [phone]

## 2018-04-12 NOTE — Care Management Obs Status (Signed)
MEDICARE OBSERVATION STATUS NOTIFICATION   Patient Details  Name: Destiny Ruiz MRN: [id number] Date of Birth: 08/30/1930   Medicare Observation Status Notification Given:  Yes    Jaleea Alesi S, RN 04/12/2018, 1:18 PM 

## 2018-04-12 NOTE — Progress Notes (Signed)
STROKE TEAM PROGRESS NOTE   HISTORY OF PRESENT ILLNESS (per record) Destiny Ruiz is an 84 y.o. female who presents to the ED via EMS for acute onset of LUE and LLE weakness in addition to 10/10 bifrontal headache. The headache began first and then while in the shower she noticed that her left side was weak. LKN 0500.   She denies a prior history of stroke. She takes ASA at home and medications also include antihypertensives and a statin.   Denies confusion, vision changes, speech deficit, abdominal pain or right sided symptoms. Has mild CP.  LSN: [id number] tPA Given: No: Out of time window   SUBJECTIVE (INTERVAL HISTORY) No family members present. The pt feels back to baseline.brain MRI scan as well as CT angiograms are unremarkable. Lipid profile and A1c is satisfactory. Echocardiogram is also normal    OBJECTIVE Vitals:   04/11/18 1939 04/11/18 2312 04/12/18 0324 04/12/18 0729  BP: (!) 152/67 (!) 144/72 (!) 146/80 (!) 150/82  Pulse: 61 62 (!) 54 (!) 53  Resp: 18 18 18 18   Temp: 97.8 F (36.6 C) 98.6 F (37 C) 98.2 F (36.8 C) 98.7 F (37.1 C)  TempSrc: Oral Oral Oral Oral  SpO2: 100% 98% 97% 97%  Weight:      Height:        CBC:  Recent Labs  Lab 04/11/18 1055 04/11/18 1059  WBC 6.4  --   NEUTROABS 3.2  --   HGB 10.8* 11.9*  HCT 37.5 35.0*  MCV 87.8  --   PLT 144*  --     Basic Metabolic Panel:  Recent Labs  Lab 04/11/18 1055 04/11/18 1059 04/12/18 0202  NA 145 144  --   K 4.1 4.0  --   CL 109 107  --   CO2 24  --   --   GLUCOSE 91 87  --   BUN 14 16  --   CREATININE 0.86 0.90  --   CALCIUM 9.0  --   --   MG  --   --  2.0  PHOS  --   --  4.1    Lipid Panel:     Component Value Date/Time   CHOL 103 04/12/2018 0202   TRIG 140 04/12/2018 0202   HDL 30 (L) 04/12/2018 0202   CHOLHDL 3.4 04/12/2018 0202   VLDL 28 04/12/2018 0202   LDLCALC 45 04/12/2018 0202   HgbA1c:  Lab Results  Component Value Date   HGBA1C 5.9 (H) 04/12/2018   Urine  Drug Screen:     Component Value Date/Time   LABOPIA NONE DETECTED 04/11/2018 1425   COCAINSCRNUR NONE DETECTED 04/11/2018 1425   LABBENZ NONE DETECTED 04/11/2018 1425   AMPHETMU NONE DETECTED 04/11/2018 1425   THCU NONE DETECTED 04/11/2018 1425   LABBARB NONE DETECTED 04/11/2018 1425    Alcohol Level     Component Value Date/Time   ETH <10 04/11/2018 1055    IMAGING  Ct Angio Head W Or Wo Contrast Ct Angio Neck W Or Wo Contrast 04/11/2018 IMPRESSION:  1. Negative for large vessel occlusion or arterial stenosis in the head or neck. A simplified Right MCA branching pattern is suspected compared to the left. No Right MCA branch occlusion is identified.  2. Arterial tortuosity in the head and neck, aorta.    Dg Chest 2 View 04/11/2018 IMPRESSION:  No acute cardiopulmonary abnormality.    Mr Brain Wo Contrast 04/11/2018 IMPRESSION:  Chronic microvascular ischemic changes of the white matter  without acute intracranial abnormality.    Ct Head Code Stroke Wo Contrast 04/11/2018 IMPRESSION:  No acute finding.  ASPECTS is 10.    Transthoracic Echocardiogram  04/12/2018 Study Conclusions  - Left ventricle: The cavity size was normal. There was mild   concentric hypertrophy. Systolic function was normal. The   estimated ejection fraction was in the range of 60% to 65%. Wall   motion was normal; there were no regional wall motion   abnormalities. Left ventricular diastolic function parameters   were normal. - Aortic valve: Mildly calcified annulus. Trileaflet; normal   thickness leaflets. Transvalvular velocity was within the normal   range. There was no stenosis. - Mitral valve: Calcified annulus. There was trivial regurgitation. - Atrial septum: No defect or patent foramen ovale was identified. Impressions: - No cardiac source of emboli was indentified.     PHYSICAL EXAM Blood pressure (!) 150/82, pulse (!) 53, temperature 98.7 F (37.1 C), temperature source  Oral, resp. rate 18, height 5\' 5"  (1.651 m), weight 108.9 kg, SpO2 97 %.    Pleasant elderly obese African American lady not in distress. . Afebrile. Head is nontraumatic. Neck is supple without bruit.    Cardiac exam no murmur or gallop. Lungs are clear to auscultation. Distal pulses are well felt.  Neurological Exam ;  Awake  Alert oriented x 3. Normal speech and language.eye movements full without nystagmus.fundi were not visualized. Vision acuity and fields appear normal. Hearing is normal. Palatal movements are normal. Face symmetric. Tongue midline. Normal strength, tone, reflexes and coordination but poor effort on the left side with some giveaway weakness in the left arm and hand. Normal sensation. Gait deferred.   HOME MEDICATIONS:  Medications Prior to Admission  Medication Sig Dispense Refill  . acetaminophen (TYLENOL) 500 MG tablet Take 1,000 mg by mouth every 6 (six) hours as needed for mild pain or moderate pain.    Marland Kitchen. amLODipine (NORVASC) 10 MG tablet Take 10 mg by mouth daily.    Marland Kitchen. aspirin EC 81 MG tablet Take 81 mg by mouth daily.    Marland Kitchen. atenolol (TENORMIN) 100 MG tablet Take 100 mg by mouth daily.    . cholecalciferol (VITAMIN D3) 25 MCG (1000 UT) tablet Take 1,000 Units by mouth daily.    Marland Kitchen. gabapentin (NEURONTIN) 100 MG capsule Take 100 mg by mouth 3 (three) times daily.    . indomethacin (INDOCIN) 50 MG capsule Take 50 mg by mouth 3 (three) times daily as needed.    . isosorbide mononitrate (IMDUR) 60 MG 24 hr tablet Take 60 mg by mouth daily.    Marland Kitchen. losartan (COZAAR) 100 MG tablet Take 100 mg by mouth daily.     . nitroGLYCERIN (NITROSTAT) 0.4 MG SL tablet Place 0.4 mg under the tongue as needed.    Marland Kitchen. omeprazole (PRILOSEC) 20 MG capsule Take 20 mg by mouth daily.    . rosuvastatin (CRESTOR) 10 MG tablet Take 10 mg by mouth at bedtime.    Marland Kitchen. oxyCODONE-acetaminophen (ROXICET) 5-325 MG per tablet Take 1 tablet by mouth every 6 (six) hours as needed. (Patient not taking: Reported  on 04/11/2018) 20 tablet 0  . predniSONE (DELTASONE) 10 MG tablet Take 1 tablet (10 mg total) by mouth daily. (Patient not taking: Reported on 04/11/2018) 21 tablet 0      HOSPITAL MEDICATIONS:  . aspirin EC  81 mg Oral Daily  . enoxaparin (LOVENOX) injection  40 mg Subcutaneous Q24H  . rosuvastatin  20 mg Oral q1800  ASSESSMENT/PLAN [name] is a 84 y.o. female with history of DVT, Htn, Hld, and neuropathy presenting with Lt sided weakness and HA.Marland Kitchen She did not receive IV t-PA due to late presentation.  Possible TIA:    Resultant - returned to baseline  CT head - No acute finding.  MRI head - Chronic microvascular ischemic changes of the white matter without acute intracranial abnormality.   MRA head  - not ordered  CTA H&N - A simplified Right MCA branching pattern is suspected compared to the left.  Carotid Doppler - CTA neck performed - carotid dopplers not indicated.  2D Echo  - EF 60 - 65%. No cardiac source of emboli identified.   LDL - 45  HgbA1c - 5.9  UDS - negative  VTE prophylaxis - Lovenox  Diet -  - Heart healthy with thin liquids.  aspirin 81 mg daily prior to admission, now on aspirin 81 mg daily  Patient counseled to be compliant with her antithrombotic medications  Ongoing aggressive stroke risk factor management  Therapy recommendations:  HH PT and OT recommended  Disposition:  Pending  Hypertension  Stable . Permissive hypertension (OK if < 220/120) but gradually normalize in 5-7 days . Long-term BP goal normotensive  Hyperlipidemia  Lipid lowering medication PTA:  Crestor 10 mg daily  LDL 45, goal < 70  Current lipid lowering medication: Crestor 20 mg daily  Continue statin at discharge    Other Stroke Risk Factors  Advanced age  Obesity, Body mass index is 39.94 kg/m., recommend weight loss, diet and exercise as appropriate    Other Active Problems  Lipitor intolerance - cramping  Plan  Ok to  discharge from stroke team's standpoint. We will sign off. Please call for questions or concerns. Continue ASA 81 mg daily.   Hospital day # 1  Delton See PA-C [facility] Pager [phone] 04/12/2018, 3:01 PM I have personally obtained history,examined this patient, reviewed notes, independently viewed imaging studies, participated in medical decision making and plan of care.ROS completed by me personally and pertinent positives fully documented  I have made any additions or clarifications directly to the above note. Agree with note above. She presented with acute onset of left-sided weakness as well as headache and appears to have improved. Brain imaging and stroke workup is unremarkable. Her exam had some features of nonorganic etiology. Recommend aspirin 81 mg daily and discharge. Greater than 50% time during this 25 minute visit was spent on counseling and coordination of care about a weakness and answering questions.  Delia Heady, MD Medical Director [facility] Pager: [phone] 04/12/2018 5:54 PM   To contact Stroke Continuity provider, please refer to [email or web address]. After hours, contact General Neurology

## 2019-11-10 IMAGING — MR MR HEAD W/O CM
9 of 10 series · 37 of 48 positions shown · non-contrast
Comparison: CTA head 04/11/2018

CLINICAL DATA: Left-sided weakness.  Headache.

EXAM:
MRI HEAD WITHOUT CONTRAST
TECHNIQUE: Multiplanar, multiecho pulse sequences of the brain and surrounding
structures were obtained without intravenous contrast.

[Series 3: DWI · axial · 3.0mm · 0.94mm/px · z∈[-91,+55]mm · 8 of 100 slices shown (1 of 2)]
[im 1/100]
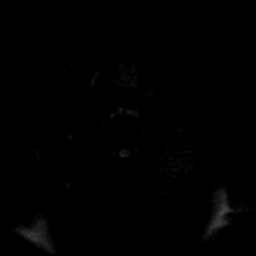
[im 12/100]
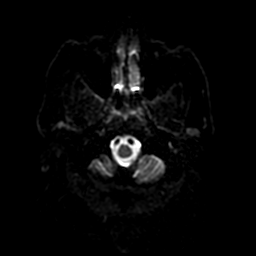
[im 34/100]
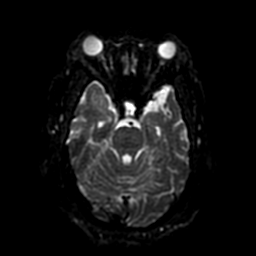
[im 45/100]
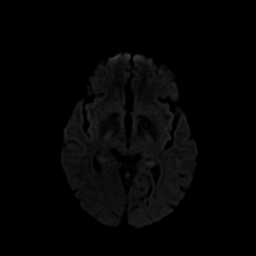
[im 56/100]
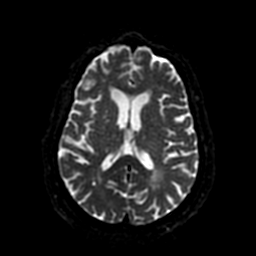
[im 67/100]
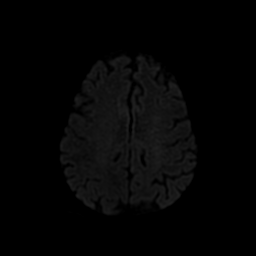
[im 89/100]
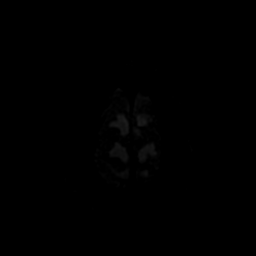
[im 100/100]
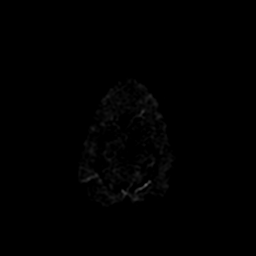

[Series 4: DWI · coronal · 4.0mm · 0.94mm/px · 7 of 72 slices shown (2 of 2)]
[im 1/72]
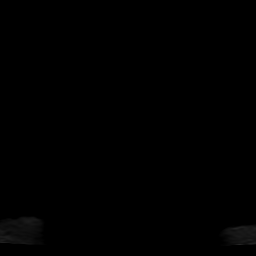
[im 12/72]
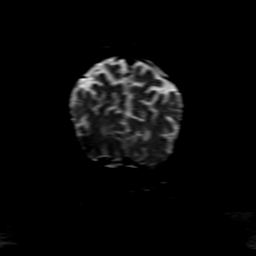
[im 24/72]
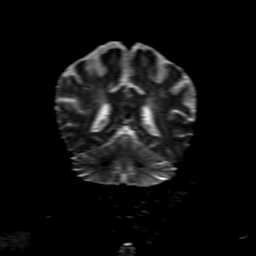
[im 36/72]
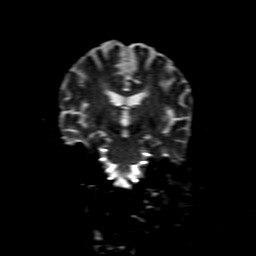
[im 48/72]
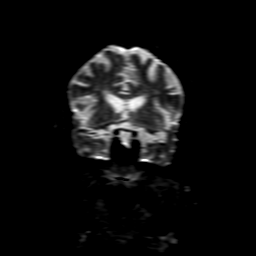
[im 60/72]
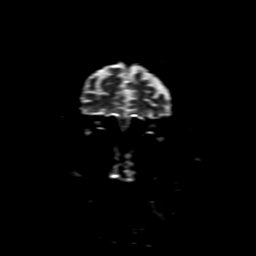
[im 72/72]
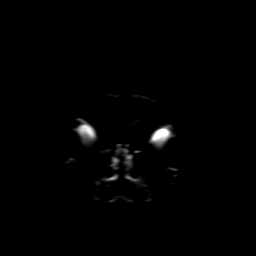

[Series 5: FLAIR · sagittal · 5.0mm · 0.47mm/px · 2 of 23 slices shown (1 of 2)]
[im 1/23]
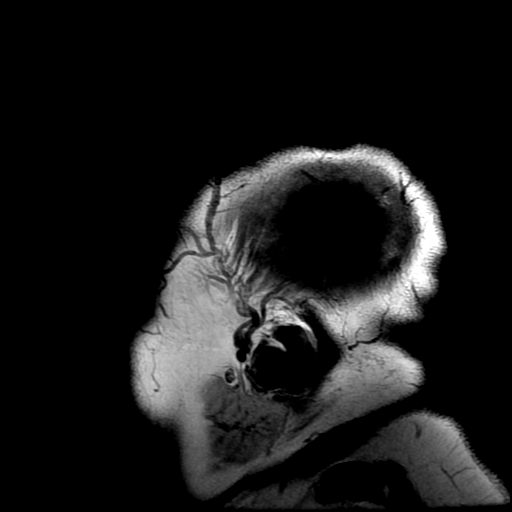
[im 23/23]
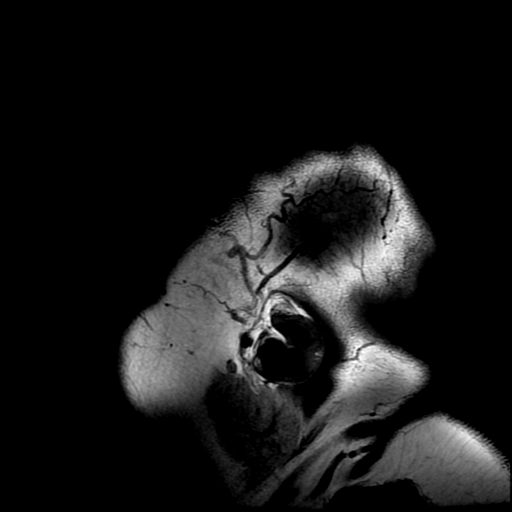

[Series 7: T2 · axial · 5.0mm · 0.47mm/px · z∈[-89,+53]mm · 2 of 25 slices shown (1 of 2)]
[im 1/25]
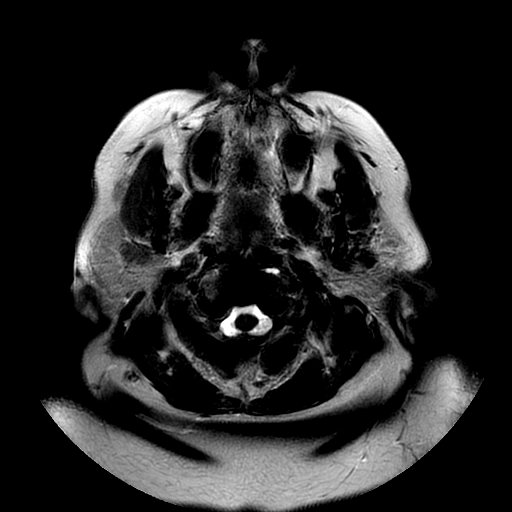
[im 25/25]
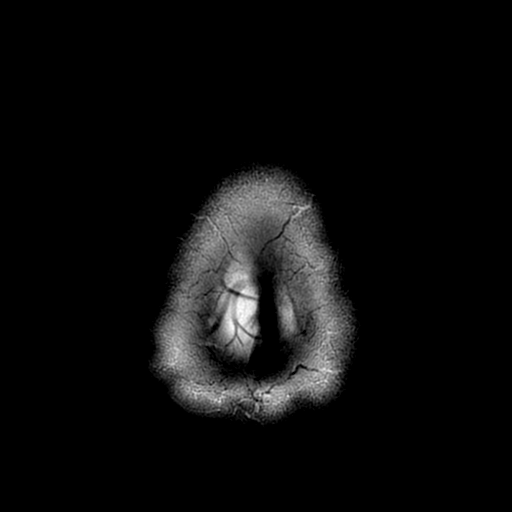

[Series 8: FLAIR · axial · 5.0mm · 0.47mm/px · z∈[-89,+53]mm · 2 of 25 slices shown (2 of 2)]
[im 1/25]
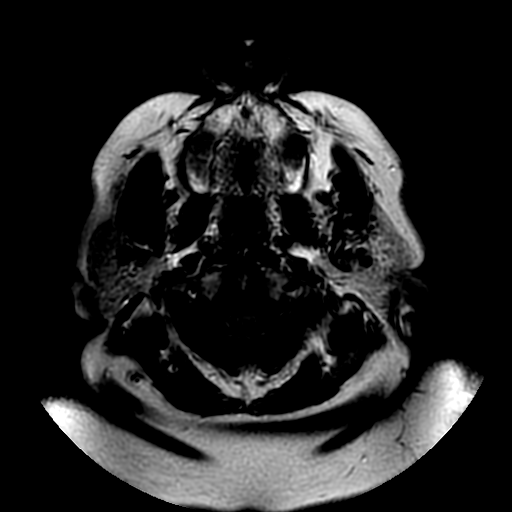
[im 25/25]
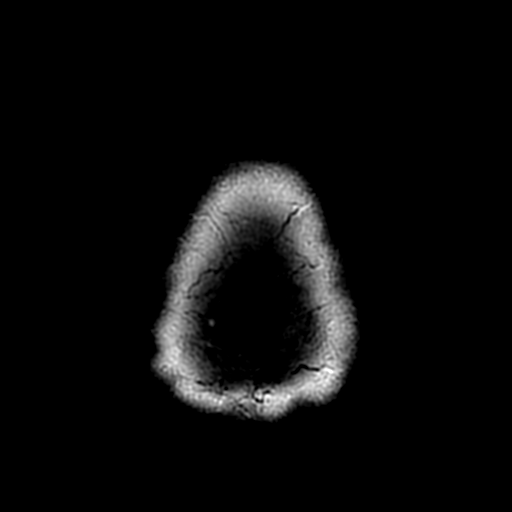

[Series 9: (person_name) · axial · 3.0mm · 0.47mm/px · z∈[-91,-0]mm · 5 of 100 slices shown]
[im 1/100]
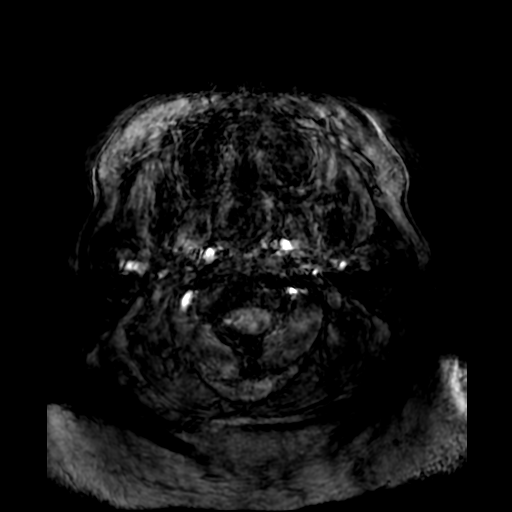
[im 13/100]
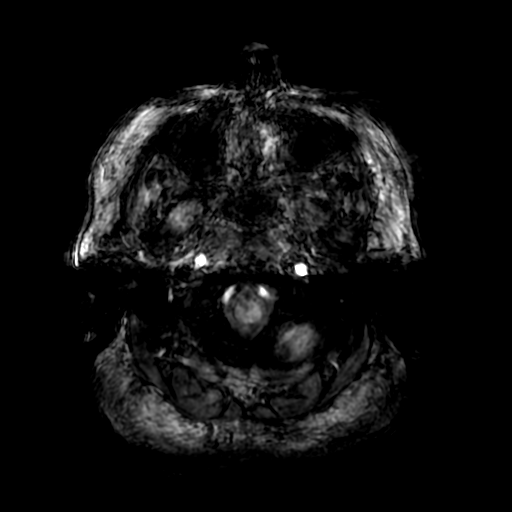
[im 25/100]
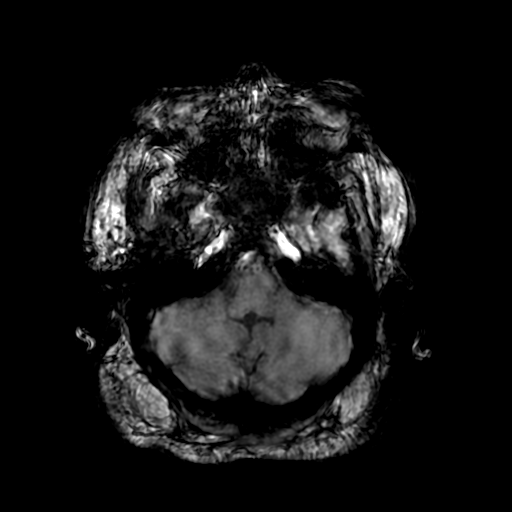
[im 38/100]
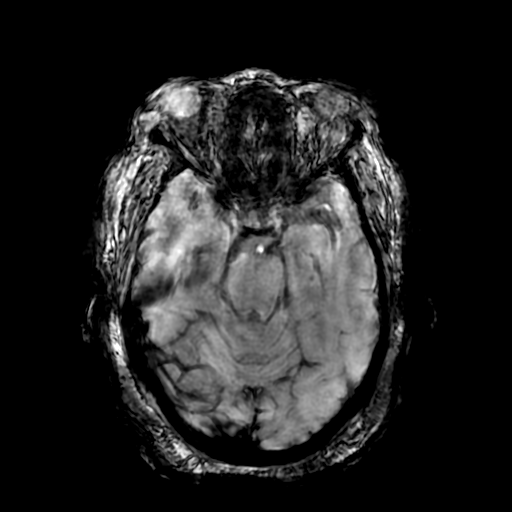
[im 62/100]
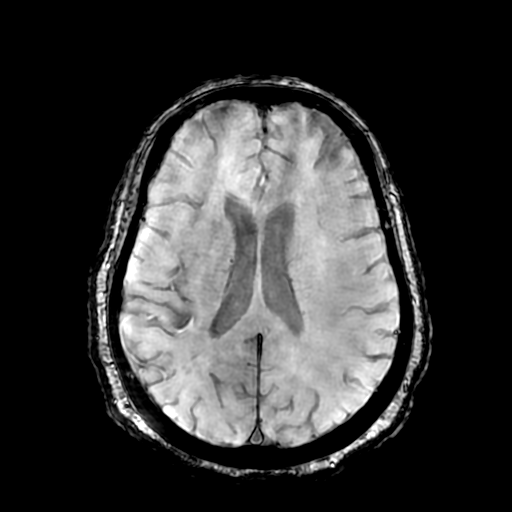

[Series 11: T2 · coronal · 5.0mm · 0.94mm/px · 3 of 30 slices shown (2 of 2)]
[im 1/30]
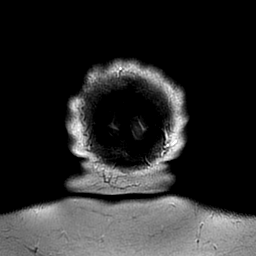
[im 15/30]
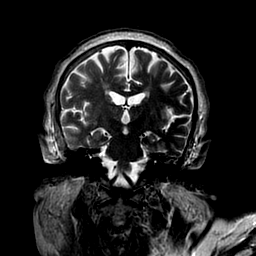
[im 30/30]
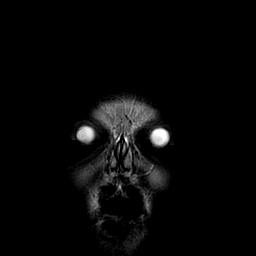

[Series 350: ADC · axial · 3.0mm · 0.94mm/px · z∈[-91,+55]mm · 5 of 50 slices shown (1 of 2)]
[im 1/50]
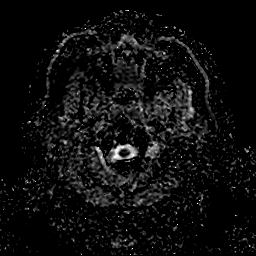
[im 13/50]
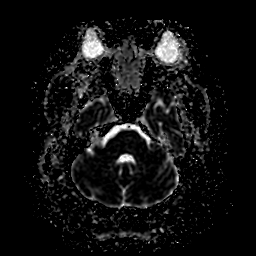
[im 25/50]
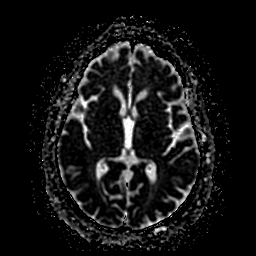
[im 37/50]
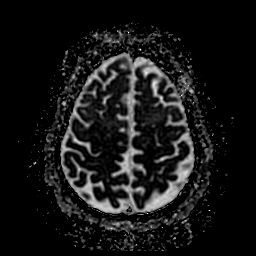
[im 50/50]
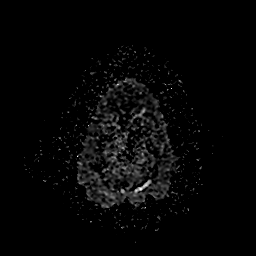

[Series 450: ADC · coronal · 4.0mm · 0.94mm/px · 3 of 36 slices shown (2 of 2)]
[im 1/36]
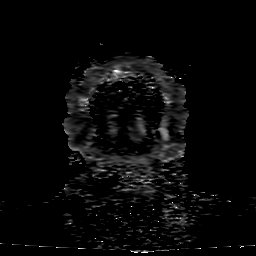
[im 18/36]
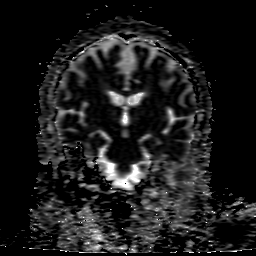
[im 36/36]
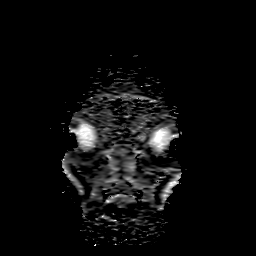

[37 of 48 positions shown; findings below may reference images not displayed]

FINDINGS: BRAIN: There is no acute infarct, acute hemorrhage, hydrocephalus or
extra-axial collection. The midline structures are normal. No
midline shift or other mass effect. There are no old infarcts. Early
confluent hyperintense T2-weighted signal of the periventricular and
deep white matter, most commonly due to chronic ischemic
microangiopathy. The cerebral and cerebellar volume are
age-appropriate. Susceptibility-sensitive sequences show no chronic
microhemorrhage or superficial siderosis.

VASCULAR: Major intracranial arterial and venous sinus flow voids
are normal.

SKULL AND UPPER CERVICAL SPINE: Calvarial bone marrow signal is
normal. There is no skull base mass. Visualized upper cervical spine
and soft tissues are normal.

SINUSES/ORBITS: No fluid levels or advanced mucosal thickening. No
mastoid or middle ear effusion. The orbits are normal.
IMPRESSION: Chronic microvascular ischemic changes of the white matter without
acute intracranial abnormality.

## 2021-04-13 ENCOUNTER — Encounter (INDEPENDENT_AMBULATORY_CARE_PROVIDER_SITE_OTHER): Payer: Self-pay

## 2021-04-13 ENCOUNTER — Encounter (INDEPENDENT_AMBULATORY_CARE_PROVIDER_SITE_OTHER): Payer: Self-pay | Admitting: Nurse Practitioner

## 2021-05-17 ENCOUNTER — Other Ambulatory Visit (INDEPENDENT_AMBULATORY_CARE_PROVIDER_SITE_OTHER): Payer: Self-pay | Admitting: Nurse Practitioner

## 2021-05-17 DIAGNOSIS — M7989 Other specified soft tissue disorders: Secondary | ICD-10-CM

## 2021-05-17 DIAGNOSIS — Z86718 Personal history of other venous thrombosis and embolism: Secondary | ICD-10-CM

## 2021-05-18 ENCOUNTER — Encounter (INDEPENDENT_AMBULATORY_CARE_PROVIDER_SITE_OTHER): Payer: Self-pay

## 2021-05-18 ENCOUNTER — Encounter (INDEPENDENT_AMBULATORY_CARE_PROVIDER_SITE_OTHER): Payer: Self-pay | Admitting: Nurse Practitioner

## 2021-06-27 ENCOUNTER — Other Ambulatory Visit: Payer: Self-pay | Admitting: Family Medicine

## 2021-06-27 DIAGNOSIS — I890 Lymphedema, not elsewhere classified: Secondary | ICD-10-CM

## 2021-07-08 ENCOUNTER — Ambulatory Visit
Admission: RE | Admit: 2021-07-08 | Discharge: 2021-07-08 | Disposition: A | Discharge status: Home / Self Care | Payer: Medicare Other | Source: Ambulatory Visit | Attending: Family Medicine | Admitting: Family Medicine

## 2021-07-08 ENCOUNTER — Other Ambulatory Visit: Payer: Self-pay

## 2021-07-08 DIAGNOSIS — I890 Lymphedema, not elsewhere classified: Secondary | ICD-10-CM | POA: Insufficient documentation

## 2022-12-07 ENCOUNTER — Emergency Department
Admission: EM | Admit: 2022-12-07 | Discharge: 2022-12-08 | Disposition: A | Discharge status: Skilled Nursing Facility | Payer: Medicare Other | Source: Home / Self Care | Attending: Emergency Medicine | Admitting: Emergency Medicine

## 2022-12-07 DIAGNOSIS — D649 Anemia, unspecified: Secondary | ICD-10-CM

## 2022-12-07 DIAGNOSIS — I890 Lymphedema, not elsewhere classified: Secondary | ICD-10-CM

## 2022-12-07 DIAGNOSIS — R2240 Localized swelling, mass and lump, unspecified lower limb: Secondary | ICD-10-CM | POA: Diagnosis present

## 2022-12-07 DIAGNOSIS — N390 Urinary tract infection, site not specified: Secondary | ICD-10-CM | POA: Diagnosis not present

## 2022-12-07 DIAGNOSIS — Z1152 Encounter for screening for COVID-19: Secondary | ICD-10-CM | POA: Diagnosis not present

## 2022-12-07 DIAGNOSIS — I10 Essential (primary) hypertension: Secondary | ICD-10-CM | POA: Insufficient documentation

## 2022-12-07 LAB — CBC
HCT: 27.1 % — ABNORMAL LOW (ref 36.0–46.0)
Hemoglobin: 7.9 g/dL — ABNORMAL LOW (ref 12.0–15.0)
MCH: 24.2 pg — ABNORMAL LOW (ref 26.0–34.0)
MCHC: 29.2 g/dL — ABNORMAL LOW (ref 30.0–36.0)
MCV: 83.1 fL (ref 80.0–100.0)
Platelets: 232 10*3/uL (ref 150–400)
RBC: 3.26 MIL/uL — ABNORMAL LOW (ref 3.87–5.11)
RDW: 16.1 % — ABNORMAL HIGH (ref 11.5–15.5)
WBC: 7.7 10*3/uL (ref 4.0–10.5)
nRBC: 0 % (ref 0.0–0.2)

## 2022-12-07 LAB — COMPREHENSIVE METABOLIC PANEL
ALT: 10 U/L (ref 0–44)
AST: 18 U/L (ref 15–41)
Albumin: 3.4 g/dL — ABNORMAL LOW (ref 3.5–5.0)
Alkaline Phosphatase: 45 U/L (ref 38–126)
Anion gap: 7 (ref 5–15)
BUN: 18 mg/dL (ref 8–23)
CO2: 27 mmol/L (ref 22–32)
Calcium: 8.4 mg/dL — ABNORMAL LOW (ref 8.9–10.3)
Chloride: 104 mmol/L (ref 98–111)
Creatinine, Ser: 1.05 mg/dL — ABNORMAL HIGH (ref 0.44–1.00)
GFR, Estimated: 50 mL/min — ABNORMAL LOW (ref >60)
Glucose, Bld: 112 mg/dL — ABNORMAL HIGH (ref 70–99)
Potassium: 4.4 mmol/L (ref 3.5–5.1)
Sodium: 138 mmol/L (ref 135–145)
Total Bilirubin: 0.9 mg/dL (ref 0.3–1.2)
Total Protein: 7.4 g/dL (ref 6.5–8.1)

## 2022-12-07 LAB — RESP PANEL BY RT-PCR (RSV, FLU A&B, COVID)  RVPGX2
Influenza A by PCR: NEGATIVE
Influenza B by PCR: NEGATIVE
Resp Syncytial Virus by PCR: NEGATIVE
SARS Coronavirus 2 by RT PCR: NEGATIVE

## 2022-12-07 MED ORDER — TRAMADOL HCL 50 MG PO TABS
50.0000 mg | ORAL_TABLET | Freq: Once | ORAL | Status: DC
  Filled 2022-12-07: qty 1

## 2022-12-07 NOTE — ED Triage Notes (Signed)
Pt BIB EMS from [facility] for weakness called in by friend. Patient stating her ankles are more swollen than normal.

## 2022-12-07 NOTE — Discharge Instructions (Addendum)
Please call the number provided for hematology to arrange a follow-up appointment regarding your low blood counts today which could be contributing to weakness.  Take and finish antibiotic as prescribed.  Return to the ER for worsening symptoms, persistent vomiting, difficulty breathing or other concerns.

## 2022-12-07 NOTE — ED Provider Notes (Signed)
[facility] Provider Note    Event Date/Time   First MD Initiated Contact with Patient 12/07/22 2145     (approximate)  History   Chief Complaint: Leg swelling, weakness  HPI  Destiny Ruiz is a 89 y.o. female with a past medical history of gastric reflux, hypertension, hyperlipidemia, chronic lymphedema, presents to the emergency department with concerns for generalized weakness.  Patient states usually she is able to walk somewhat or at least assist with transfers, etc.  Physical Exam   Triage Vital Signs: ED Triage Vitals  Encounter Vitals Group     BP 12/07/22 2136 (!) 139/49     Systolic BP Percentile --      Diastolic BP Percentile --      Pulse Rate 12/07/22 2136 74     Resp 12/07/22 2136 18     Temp 12/07/22 2136 98.3 F (36.8 C)     Temp Source 12/07/22 2136 Oral     SpO2 12/07/22 2135 98 %     Weight --      Height 12/07/22 2140 5\' 5"  (1.651 m)     Head Circumference --      Peak Flow --      Pain Score --      Pain Loc --      Pain Education --      Exclude from Growth Chart --    Most recent vital signs: Vitals:   12/07/22 2135 12/07/22 2136  BP:  (!) 139/49  Pulse:  74  Resp:  18  Temp:  98.3 F (36.8 C)  SpO2: 98% 94%   General: Awake, no distress.  CV:  Good peripheral perfusion.  Regular rate and rhythm  Resp:  Normal effort.  Equal breath sounds bilaterally.  Abd:  No distention.  Soft, nontender.  No rebound or guarding.  ED Results / Procedures / Treatments   MEDICATIONS ORDERED IN ED: Medications  traMADol (ULTRAM) tablet 50 mg (has no administration in time range)     IMPRESSION / MDM / ASSESSMENT AND PLAN / ED COURSE  I reviewed the triage vital signs and the nursing notes.  Patient's presentation is most consistent with acute presentation with potential threat to life or bodily function.  Patient presents emergency department with complaints of weakness.  Patient has chronic lymphedema but states  she could normally help transfer.  States she largely stays in bed or in a wheelchair.  She states her friend thought she was more weak today so they called EMS to bring her to the emergency department.  Here the patient is awake alert she is answering questions appropriately and follows commands.  Patient has lower extremity edema but this appears to be quite chronic consistent with her history of chronic lymphedema.  Patient's lab work shows a reassuring chemistry.  Patient CBC does show anemia with a hemoglobin of 7.9 MCV borderline low at 83.  Last lab we have from the patient is 4 years ago at which time the patient's hemoglobin was closer to 11.  I performed rectal examination which shows light brown stool guaiac negative.  Patient's anemia could be contributing to some weakness but with no active bleed and hemoglobin of 7.9 patient does not require transfusion and I suspect this is likely more chronic in nature.  Will refer to hematology for further evaluation and possible iron infusions or supplementation if warranted.  Patient's COVID and urine are pending.  As long as there is no significant findings  anticipate likely discharge home.  I discussed this with the patient who is agreeable to this plan of care as well.  FINAL CLINICAL IMPRESSION(S) / ED DIAGNOSES   Weakness Chronic lymphedema Anemia  Note:  This document was prepared using Dragon voice recognition software and may include unintentional dictation errors.   Minna Antis, MD 12/07/22 2310

## 2022-12-08 DIAGNOSIS — N390 Urinary tract infection, site not specified: Secondary | ICD-10-CM | POA: Diagnosis not present

## 2022-12-08 MED ORDER — CEPHALEXIN 250 MG PO CAPS: 250.0000 mg | ORAL_CAPSULE | Freq: Three times a day (TID) | ORAL | 0 refills | Status: DC

## 2022-12-08 MED ORDER — CEPHALEXIN 250 MG PO CAPS
250.0000 mg | ORAL_CAPSULE | Freq: Once | ORAL | Status: AC
  Administered 2022-12-08: 250 mg via ORAL
  Filled 2022-12-08: qty 1

## 2022-12-08 NOTE — ED Notes (Signed)
Provided urine incontinence care, replaced bed linen and bed pads.

## 2022-12-08 NOTE — ED Provider Notes (Signed)
-----------------------------------------   1:12 AM on 12/08/2022 -----------------------------------------   Respiratory panel is negative.  Will start Keflex for UTI.  Updated patient of both results.  Strict return precautions given.  Patient verbalizes understanding agrees with plan of care.   Irean Hong, MD 12/08/22 [phone]

## 2022-12-08 NOTE — ED Notes (Signed)
Ccom was called for pt. returning to home place of [facility] per [place]. Spoke with rep. Carley she stated a truck will be out when one is available.

## 2022-12-08 NOTE — ED Notes (Signed)
Attempted to call home place of [facility] 3 times for report. Unable to reach staff to provide report. 2nd RN attempted report as well but unsuccessful in getting in touch with facility.

## 2022-12-08 NOTE — ED Notes (Signed)
 Pt is asleep, RR and WOB WNL, NAD. No needs identified. Call light within reach, side rails up x2, bed locked and in the lowest position.

## 2022-12-13 ENCOUNTER — Other Ambulatory Visit: Payer: Self-pay

## 2022-12-13 ENCOUNTER — Emergency Department
Admission: EM | Admit: 2022-12-13 | Discharge: 2022-12-13 | Disposition: A | Discharge status: Home / Self Care | Payer: Medicare Other | Attending: Emergency Medicine | Admitting: Emergency Medicine

## 2022-12-13 ENCOUNTER — Emergency Department: Payer: Medicare Other

## 2022-12-13 DIAGNOSIS — Z7982 Long term (current) use of aspirin: Secondary | ICD-10-CM | POA: Diagnosis not present

## 2022-12-13 DIAGNOSIS — I10 Essential (primary) hypertension: Secondary | ICD-10-CM | POA: Insufficient documentation

## 2022-12-13 DIAGNOSIS — R531 Weakness: Secondary | ICD-10-CM | POA: Diagnosis not present

## 2022-12-13 DIAGNOSIS — Z79899 Other long term (current) drug therapy: Secondary | ICD-10-CM | POA: Insufficient documentation

## 2022-12-13 LAB — URINALYSIS, ROUTINE W REFLEX MICROSCOPIC
Bilirubin Urine: NEGATIVE
Glucose, UA: NEGATIVE mg/dL
Hgb urine dipstick: NEGATIVE
Ketones, ur: NEGATIVE mg/dL
Nitrite: NEGATIVE
Protein, ur: NEGATIVE mg/dL
Specific Gravity, Urine: 1.005 (ref 1.005–1.030)
Squamous Epithelial / HPF: NONE SEEN /HPF (ref 0–5)
pH: 5 (ref 5.0–8.0)

## 2022-12-13 LAB — TSH: TSH: 2.044 u[IU]/mL (ref 0.350–4.500)

## 2022-12-13 MED ORDER — ACETAMINOPHEN 500 MG PO TABS
1000.0000 mg | ORAL_TABLET | Freq: Once | ORAL | Status: AC
  Administered 2022-12-13: 1000 mg via ORAL
  Filled 2022-12-13: qty 2

## 2022-12-13 NOTE — ED Provider Notes (Signed)
[facility] Provider Note    Event Date/Time   First MD Initiated Contact with Patient 12/13/22 [phone]     (approximate)   History   Weakness   HPI  Destiny Ruiz is a 89 y.o. female with history of hypertension, hyperlipidemia, obesity, anemia who presents to the emergency department with EMS with complaints of left-sided weakness.  States she went to bed last night at 10 PM in her normal state of health.  She got up this morning just prior to arrival and could not walk.  She normally uses a walker or Hoveround to get around.  She states she lives at home by herself.  She states that she has had "a mini stroke" that left her with some residual left-sided weakness in her arm and leg but feels like it is worse today.  She states she also feels somewhat weak all over.  Complains of pain in bilateral lower extremities.  She has swelling in both legs that she states has been there for a month.  Denies any chest pain or shortness of breath, vomiting or diarrhea, dysuria.  Patient was just here in the emergency department on 12/07/2022 for generalized weakness and was diagnosed with a UTI and discharged on Keflex.  [facility].   History provided by patient, EMS.    Past Medical History:  Diagnosis Date   DVT (deep venous thrombosis) (HCC)    GERD (gastroesophageal reflux disease)    Gout    High cholesterol    Hypertension    Neuropathy     Past Surgical History:  Procedure Laterality Date   ABDOMINAL HYSTERECTOMY     JOINT REPLACEMENT      MEDICATIONS:  Prior to Admission medications   Medication Sig Start Date End Date Taking? Authorizing Provider  acetaminophen (TYLENOL) 500 MG tablet Take 1,000 mg by mouth every 6 (six) hours as needed for mild pain or moderate pain.    [provider]  amLODipine (NORVASC) 10 MG tablet Take 10 mg by mouth daily.    [provider]  aspirin  EC 81 MG tablet Take 81 mg by mouth daily.    [provider]  atenolol (TENORMIN) 100 MG tablet Take 100 mg by mouth daily.    [provider]  cephALEXin (KEFLEX) 250 MG capsule Take 1 capsule (250 mg total) by mouth 3 (three) times daily. 12/08/22   Irean Hong, MD  cholecalciferol (VITAMIN D3) 25 MCG (1000 UT) tablet Take 1,000 Units by mouth daily.    [provider]  gabapentin (NEURONTIN) 100 MG capsule Take 100 mg by mouth 3 (three) times daily.    [provider]  indomethacin (INDOCIN) 50 MG capsule Take 50 mg by mouth 3 (three) times daily as needed.    [provider]  isosorbide mononitrate (IMDUR) 60 MG 24 hr tablet Take 60 mg by mouth daily.    [provider]  losartan (COZAAR) 100 MG tablet Take 100 mg by mouth daily.     [provider]  nitroGLYCERIN (NITROSTAT) 0.4 MG SL tablet Place 0.4 mg under the tongue as needed.    [provider]  omeprazole (PRILOSEC) 20 MG capsule Take 20 mg by mouth daily.    [provider]  rosuvastatin (CRESTOR) 20 MG tablet Take 1 tablet (20 mg total) by mouth daily at 6 PM. 04/12/18 05/12/18  Synetta Shadow, MD    Physical  Exam   Triage Vital Signs: ED Triage Vitals  Encounter Vitals Group     BP 12/13/22 0626 135/65     Systolic BP Percentile --      Diastolic BP Percentile --      Pulse Rate 12/13/22 0626 (!) 51     Resp 12/13/22 0626 20     Temp 12/13/22 0626 98.2 F (36.8 C)     Temp Source 12/13/22 0626 Oral     SpO2 12/13/22 0621 96 %     Weight 12/13/22 0624 (!) 320 lb (145.2 kg)     Height 12/13/22 0624 5\' 4"  (1.626 m)     Head Circumference --      Peak Flow --      Pain Score 12/13/22 0623 10     Pain Loc --      Pain Education --      Exclude from Growth Chart --      Most recent vital signs: Vitals:   12/13/22 0621 12/13/22 0626  BP:  135/65  Pulse:  (!) 51  Resp:  20  Temp:  98.2 F (36.8 C)  SpO2: 96% 98%    CONSTITUTIONAL:  Alert, responds appropriately to questions.  Obese HEAD: Normocephalic, atraumatic EYES: Conjunctivae clear, pupils appear equal, sclera nonicteric ENT: normal nose; moist mucous membranes NECK: Supple, normal ROM CARD: RRR; S1 and S2 appreciated RESP: Normal chest excursion without splinting or tachypnea; breath sounds clear and equal bilaterally; no wheezes, no rhonchi, no rales, no hypoxia or respiratory distress, speaking full sentences ABD/GI: Non-distended; soft, non-tender, no rebound, no guarding, no peritoneal signs BACK: The back appears normal EXT: Normal ROM in all joints; no deformity noted, chronic symmetric lymphedema to bilateral lower extremities, extremities warm and well-perfused, no calf tenderness or calf swelling SKIN: Normal color for age and race; warm; no rash on exposed skin NEURO: No pronator drift, normal speech, normal sensation, cranial nerves II through XII intact, slightly weaker in the left upper extremity compared to the right, has difficult time lifting either leg off the bed PSYCH: The patient's mood and manner are appropriate.   ED Results / Procedures / Treatments   LABS: (all labs ordered are listed, but only abnormal results are displayed) Labs Reviewed  CBC  DIFFERENTIAL  COMPREHENSIVE METABOLIC PANEL  URINALYSIS, ROUTINE W REFLEX MICROSCOPIC  TSH  T4, FREE  CBG MONITORING, ED     EKG:  EKG Interpretation Date/Time:  Wednesday December 13 2022 06:41:24 EDT Ventricular Rate:  52 PR Interval:  217 QRS Duration:  113 QT Interval:  486 QTC Calculation: 452 R Axis:   10  Text Interpretation: Sinus rhythm Borderline prolonged PR interval Borderline intraventricular conduction delay Low voltage, precordial leads Confirmed by Rochele Raring [phone]) on 12/13/2022 6:42:53 AM         RADIOLOGY: My personal review and interpretation of imaging:    I have personally reviewed all radiology reports.   No results  found.   PROCEDURES:  Critical Care performed: No      .1-3 Lead EKG Interpretation  Performed by: Kevina Piloto, Layla Maw, DO Authorized by: Meleah Demeyer, Layla Maw, DO     Interpretation: abnormal     ECG rate:  51   ECG rate assessment: bradycardic     Rhythm: sinus bradycardia     Ectopy: none     Conduction: normal       IMPRESSION / MDM / ASSESSMENT AND PLAN / ED COURSE  I reviewed the triage vital signs  and the nursing notes.    Patient here with generalized weakness and left-sided weakness.  Last known well 10 PM.  The patient is on the cardiac monitor to evaluate for evidence of arrhythmia and/or significant heart rate changes.   DIFFERENTIAL DIAGNOSIS (includes but not limited to):   Stroke, TIA, electrolyte derangement, anemia, thyroid dysfunction, UTI, dehydration, deconditioning   Patient's presentation is most consistent with acute presentation with potential threat to life or bodily function.   PLAN: Will obtain labs, urine, CT head to rule out intracranial hemorrhage, MRI brain to evaluate for stroke.  Will give Tylenol for her leg pain.  She states that the swelling in her legs have been present for only a month however it appears more likely to be chronic lymphedema.  There is no asymmetry to suggest DVT and no calf tenderness.  It appears in her chart she has a history of chronic lymphedema.  Suspect that this could also be playing a role into why she was not able to get up and walk today.  It looks like she was just seen in the emergency department last week for symptoms of weakness and was found to have a UTI and discharged on Keflex.  She is in a skilled nursing facility.   Patient outside of tPA window and is not LVO positive.  MEDICATIONS GIVEN IN ED: Medications  acetaminophen (TYLENOL) tablet 1,000 mg (has no administration in time range)     ED COURSE: EKG nonischemic without interval changes or arrhythmia.  Patient's care signed over to oncoming EDP  at 7 AM.  CONSULTS: Dispo pending further workup.   OUTSIDE RECORDS REVIEWED: Reviewed last admission in 2019 for TIA.       FINAL CLINICAL IMPRESSION(S) / ED DIAGNOSES   Final diagnoses:  Weakness     Rx / DC Orders   ED Discharge Orders     None        Note:  This document was prepared using Dragon voice recognition software and may include unintentional dictation errors.   Jeromiah Ohalloran, Layla Maw, DO 12/13/22 [phone]

## 2022-12-13 NOTE — ED Notes (Signed)
Pt tx to MRI

## 2022-12-13 NOTE — ED Provider Notes (Signed)
Care of this patient assumed from prior physician at 0700 pending MRI, completion of workup, and disposition. Please see prior physician note for further details.  Briefly this is a 89 year old female who presented with generalized weakness, worse on her left side.  Labs, MRI brain pending at time of signout.  MRI resulted without acute abnormality.  Significant difficulty with IV access.  Multiple failed attempts by nursing and IV team also unsuccessful placing a line.  I was able to place a IV and obtain some blood work.  A TSH was able to be run which returned within normal limits, but remainder of blood work unfortunately hemolyzed.  Reviewed with patient and family.  She had blood work performed during her recent ER visit 6 days ago that was without severe derangement.  Family would like to hold off on further blood work which I do think is reasonable.  Will obtain repeat urinalysis given recent UTI.  Despite her weakness, both patient and family feel that she can be safely discharged back to her independent living facility.  She has been working with her primary care doctor for a plan regarding her weakness.   Repeat urinalysis obtained here which demonstrates leukocyte esterase, 11-20 white blood cells, rare bacteria, improved from prior though not completely without evidence of infection.  Reviewed with patient and family.  She actually only started her antibiotics 2 days ago due to an issue with filling from the pharmacy.  In the setting of this, do not think this is reflective of treatment failure.  Patient reports feeling much improved on reevaluation.  Both her and her daughter are comfortable with discharge home.  She was discharged home in stable condition with strict return precautions.   Ultrasound ED Peripheral IV (Provider)  Date/Time: 12/13/2022 10:00 AM  Performed by: Trinna Post, MD Authorized by: Trinna Post, MD   Procedure details:    Indications: multiple failed IV attempts     Skin  Prep: chlorhexidine gluconate     Angiocath:  20 G   Bedside Ultrasound Guided: Yes     Images: not archived     Patient tolerated procedure without complications: Yes   Comments:     Left arm proximal to [facility]     Trinna Post, MD 12/13/22 [phone]

## 2022-12-13 NOTE — Progress Notes (Signed)
There is the consult for placement of  PIV access. Assessed both arm with USG, attempted once on Rt. Posterior forearm which was unsuccessful. There was no suitable veins other than attempted vein. Informed patient's RN & MD regarding this matter. Recommended PICC or central line for this patient.  HS [employer]

## 2022-12-13 NOTE — Discharge Instructions (Addendum)
You were seen in the ER today for evaluation of your weakness.  Your MRI fortunately was reassuring.  Please continue to follow-up closely with your primary care doctor and follow their plan for management of your weakness.  Please also complete the antibiotic course for your UTI as previously prescribed.  Return to the ER for new or worsening symptoms.

## 2022-12-13 NOTE — ED Triage Notes (Signed)
Pt presents to the ED via EMS c/o weakness and unable to ambulate or assist with ADL's. Pt states she has been getting weaker over the last week and this morning was unable to get out of bed.

## 2023-10-13 ENCOUNTER — Inpatient Hospital Stay (HOSPITAL_COMMUNITY)
Admission: EM | Admit: 2023-10-13 | Discharge: 2023-10-16 | DRG: 065 | Disposition: A | Discharge status: Skilled Nursing Facility | Attending: Family Medicine | Admitting: Family Medicine

## 2023-10-13 ENCOUNTER — Emergency Department (HOSPITAL_COMMUNITY)

## 2023-10-13 ENCOUNTER — Other Ambulatory Visit: Payer: Self-pay

## 2023-10-13 ENCOUNTER — Inpatient Hospital Stay (HOSPITAL_COMMUNITY)

## 2023-10-13 ENCOUNTER — Encounter (HOSPITAL_COMMUNITY): Payer: Self-pay

## 2023-10-13 DIAGNOSIS — Z79899 Other long term (current) drug therapy: Secondary | ICD-10-CM

## 2023-10-13 DIAGNOSIS — M109 Gout, unspecified: Secondary | ICD-10-CM | POA: Diagnosis present

## 2023-10-13 DIAGNOSIS — Z888 Allergy status to other drugs, medicaments and biological substances status: Secondary | ICD-10-CM

## 2023-10-13 DIAGNOSIS — I639 Cerebral infarction, unspecified: Secondary | ICD-10-CM | POA: Diagnosis present

## 2023-10-13 DIAGNOSIS — Z7902 Long term (current) use of antithrombotics/antiplatelets: Secondary | ICD-10-CM

## 2023-10-13 DIAGNOSIS — I10 Essential (primary) hypertension: Secondary | ICD-10-CM | POA: Diagnosis not present

## 2023-10-13 DIAGNOSIS — Z7982 Long term (current) use of aspirin: Secondary | ICD-10-CM | POA: Diagnosis not present

## 2023-10-13 DIAGNOSIS — I6310 Cerebral infarction due to embolism of unspecified precerebral artery: Secondary | ICD-10-CM | POA: Diagnosis present

## 2023-10-13 DIAGNOSIS — Z6841 Body Mass Index (BMI) 40.0 and over, adult: Secondary | ICD-10-CM | POA: Diagnosis not present

## 2023-10-13 DIAGNOSIS — G8191 Hemiplegia, unspecified affecting right dominant side: Secondary | ICD-10-CM | POA: Diagnosis present

## 2023-10-13 DIAGNOSIS — R29713 NIHSS score 13: Secondary | ICD-10-CM | POA: Diagnosis present

## 2023-10-13 DIAGNOSIS — I890 Lymphedema, not elsewhere classified: Secondary | ICD-10-CM | POA: Diagnosis present

## 2023-10-13 DIAGNOSIS — E7800 Pure hypercholesterolemia, unspecified: Secondary | ICD-10-CM | POA: Diagnosis present

## 2023-10-13 DIAGNOSIS — R1310 Dysphagia, unspecified: Secondary | ICD-10-CM | POA: Diagnosis present

## 2023-10-13 DIAGNOSIS — R29810 Facial weakness: Secondary | ICD-10-CM | POA: Diagnosis present

## 2023-10-13 DIAGNOSIS — I69391 Dysphagia following cerebral infarction: Secondary | ICD-10-CM | POA: Diagnosis not present

## 2023-10-13 DIAGNOSIS — I739 Peripheral vascular disease, unspecified: Secondary | ICD-10-CM | POA: Diagnosis not present

## 2023-10-13 DIAGNOSIS — I119 Hypertensive heart disease without heart failure: Secondary | ICD-10-CM | POA: Diagnosis present

## 2023-10-13 DIAGNOSIS — K219 Gastro-esophageal reflux disease without esophagitis: Secondary | ICD-10-CM | POA: Diagnosis present

## 2023-10-13 DIAGNOSIS — Z86718 Personal history of other venous thrombosis and embolism: Secondary | ICD-10-CM

## 2023-10-13 DIAGNOSIS — I6389 Other cerebral infarction: Secondary | ICD-10-CM | POA: Diagnosis not present

## 2023-10-13 DIAGNOSIS — E785 Hyperlipidemia, unspecified: Secondary | ICD-10-CM | POA: Diagnosis not present

## 2023-10-13 DIAGNOSIS — Z90710 Acquired absence of both cervix and uterus: Secondary | ICD-10-CM | POA: Diagnosis not present

## 2023-10-13 DIAGNOSIS — R29705 NIHSS score 5: Secondary | ICD-10-CM | POA: Diagnosis not present

## 2023-10-13 DIAGNOSIS — G629 Polyneuropathy, unspecified: Secondary | ICD-10-CM | POA: Diagnosis present

## 2023-10-13 LAB — CBC
HCT: 39.1 % (ref 36.0–46.0)
Hemoglobin: 11.7 g/dL — ABNORMAL LOW (ref 12.0–15.0)
MCH: 26.2 pg (ref 26.0–34.0)
MCHC: 29.9 g/dL — ABNORMAL LOW (ref 30.0–36.0)
MCV: 87.7 fL (ref 80.0–100.0)
Platelets: 191 10*3/uL (ref 150–400)
RBC: 4.46 MIL/uL (ref 3.87–5.11)
RDW: 15.3 % (ref 11.5–15.5)
WBC: 6.7 10*3/uL (ref 4.0–10.5)
nRBC: 0 % (ref 0.0–0.2)

## 2023-10-13 LAB — DIFFERENTIAL
Abs Immature Granulocytes: 0.06 10*3/uL (ref 0.00–0.07)
Basophils Absolute: 0.1 10*3/uL (ref 0.0–0.1)
Basophils Relative: 1 %
Eosinophils Absolute: 0.3 10*3/uL (ref 0.0–0.5)
Eosinophils Relative: 4 %
Immature Granulocytes: 1 %
Lymphocytes Relative: 22 %
Lymphs Abs: 1.5 10*3/uL (ref 0.7–4.0)
Monocytes Absolute: 0.5 10*3/uL (ref 0.1–1.0)
Monocytes Relative: 8 %
Neutro Abs: 4.3 10*3/uL (ref 1.7–7.7)
Neutrophils Relative %: 64 %

## 2023-10-13 LAB — I-STAT CHEM 8, ED
BUN: 15 mg/dL (ref 8–23)
Calcium, Ion: 1.14 mmol/L — ABNORMAL LOW (ref 1.15–1.40)
Chloride: 103 mmol/L (ref 98–111)
Creatinine, Ser: 0.8 mg/dL (ref 0.44–1.00)
Glucose, Bld: 87 mg/dL (ref 70–99)
HCT: 39 % (ref 36.0–46.0)
Hemoglobin: 13.3 g/dL (ref 12.0–15.0)
Potassium: 4.3 mmol/L (ref 3.5–5.1)
Sodium: 143 mmol/L (ref 135–145)
TCO2: 28 mmol/L (ref 22–32)

## 2023-10-13 LAB — COMPREHENSIVE METABOLIC PANEL WITH GFR
ALT: 10 U/L (ref 0–44)
AST: 16 U/L (ref 15–41)
Albumin: 3.3 g/dL — ABNORMAL LOW (ref 3.5–5.0)
Alkaline Phosphatase: 41 U/L (ref 38–126)
Anion gap: 10 (ref 5–15)
BUN: 13 mg/dL (ref 8–23)
CO2: 26 mmol/L (ref 22–32)
Calcium: 9 mg/dL (ref 8.9–10.3)
Chloride: 106 mmol/L (ref 98–111)
Creatinine, Ser: 0.78 mg/dL (ref 0.44–1.00)
GFR, Estimated: >60 mL/min (ref >60)
Glucose, Bld: 94 mg/dL (ref 70–99)
Potassium: 4.3 mmol/L (ref 3.5–5.1)
Sodium: 142 mmol/L (ref 135–145)
Total Bilirubin: 0.7 mg/dL (ref 0.0–1.2)
Total Protein: 6.9 g/dL (ref 6.5–8.1)

## 2023-10-13 LAB — PROTIME-INR
INR: 1 (ref 0.8–1.2)
Prothrombin Time: 13.5 s (ref 11.4–15.2)

## 2023-10-13 LAB — APTT: aPTT: 30 s (ref 24–36)

## 2023-10-13 LAB — CBG MONITORING, ED: Glucose-Capillary: 88 mg/dL (ref 70–99)

## 2023-10-13 LAB — ETHANOL: Alcohol, Ethyl (B): <15 mg/dL (ref <15)

## 2023-10-13 MED ORDER — SODIUM CHLORIDE 0.9% FLUSH: 3.0000 mL | Freq: Once | INTRAVENOUS | Status: AC

## 2023-10-13 MED ORDER — HEPARIN SODIUM (PORCINE) 5000 UNIT/ML IJ SOLN
5000.0000 [IU] | Freq: Three times a day (TID) | INTRAMUSCULAR | Status: DC
  Administered 2023-10-14 – 2023-10-16 (×7): 5000 [IU] via SUBCUTANEOUS
  Filled 2023-10-13 (×7): qty 1

## 2023-10-13 MED ORDER — EZETIMIBE 10 MG PO TABS
10.0000 mg | ORAL_TABLET | Freq: Every day | ORAL | Status: DC
  Administered 2023-10-14 – 2023-10-16 (×3): 10 mg via ORAL
  Filled 2023-10-13 (×3): qty 1

## 2023-10-13 MED ORDER — PANTOPRAZOLE SODIUM 40 MG PO TBEC
40.0000 mg | DELAYED_RELEASE_TABLET | Freq: Every day | ORAL | Status: DC
  Administered 2023-10-14 – 2023-10-16 (×3): 40 mg via ORAL
  Filled 2023-10-13 (×3): qty 1

## 2023-10-13 MED ORDER — FERROUS SULFATE 325 (65 FE) MG PO TABS
325.0000 mg | ORAL_TABLET | ORAL | Status: DC
  Administered 2023-10-15: 325 mg via ORAL
  Filled 2023-10-13 (×2): qty 1

## 2023-10-13 MED ORDER — ASPIRIN 81 MG PO CHEW
81.0000 mg | CHEWABLE_TABLET | Freq: Every day | ORAL | Status: DC
  Administered 2023-10-14 – 2023-10-16 (×3): 81 mg via ORAL
  Filled 2023-10-13 (×5): qty 1

## 2023-10-13 MED ORDER — ACETAMINOPHEN 650 MG RE SUPP: 650.0000 mg | RECTAL | Status: DC | PRN

## 2023-10-13 MED ORDER — CLOPIDOGREL BISULFATE 75 MG PO TABS
75.0000 mg | ORAL_TABLET | Freq: Every day | ORAL | Status: DC
  Administered 2023-10-13 – 2023-10-16 (×4): 75 mg via ORAL
  Filled 2023-10-13 (×4): qty 1

## 2023-10-13 MED ORDER — ACETAMINOPHEN 325 MG PO TABS
650.0000 mg | ORAL_TABLET | ORAL | Status: DC | PRN
  Administered 2023-10-14 – 2023-10-15 (×4): 650 mg via ORAL
  Filled 2023-10-13 (×4): qty 2

## 2023-10-13 MED ORDER — SODIUM CHLORIDE 0.9 % IV SOLN: INTRAVENOUS | Status: AC

## 2023-10-13 MED ORDER — SENNOSIDES-DOCUSATE SODIUM 8.6-50 MG PO TABS: 1.0000 | ORAL_TABLET | Freq: Every evening | ORAL | Status: DC | PRN

## 2023-10-13 MED ORDER — ACETAMINOPHEN 160 MG/5ML PO SOLN: 650.0000 mg | ORAL | Status: DC | PRN

## 2023-10-13 MED ORDER — STROKE: EARLY STAGES OF RECOVERY BOOK: Freq: Once | Status: DC

## 2023-10-13 MED ORDER — STROKE: EARLY STAGES OF RECOVERY BOOK
Freq: Once | Status: AC
  Filled 2023-10-13: qty 1

## 2023-10-13 NOTE — ED Notes (Signed)
 IV team at bedside

## 2023-10-13 NOTE — ED Notes (Addendum)
 Delay in lab collection. Poor venous access. This paramedic attempted with USGPIV without success.

## 2023-10-13 NOTE — ED Notes (Signed)
 Called lab about status of blood, lab stated they were unable to locate the sample. MD made aware. Phlebotomy team asked to assist.

## 2023-10-13 NOTE — H&P (Signed)
 History and Physical    Destiny Ruiz UXL:[id number] DOB: 01/20/1931 DOA: 10/13/2023  PCP: Comer Decamp, MD  Patient coming from: home  I have personally briefly reviewed patient's old medical records in [facility]  Chief Complaint:   HPI: Destiny Ruiz is a 89 y.o. female with medical history significant of DVT, GERD, Gout, Essential Hypertension, Neuropathy who presents to ED with complaint of acute onset of right sided facial droop, arm weakness and leg weakness, headache as well as confusion. Patient  LSW was 0730 this am and ambulance was called at 1630.    ED Course:  In ED patient admitted as CODE STROKE  Vitals: afeb, bp 166/78, hr 66, rr 16 sat 100% on ra    Labs pending Na 143, K 4.3, cl 103, cr 0.80    CTH IMPRESSION: 1. No evidence of acute intracranial abnormality. ASPECTS of 10. 2. Mild-to-moderate chronic small vessel ischemic disease. 3. Interval chronic left cerebellar infarct.    IMPRESSION: MRI HEAD:   1. 1 cm acute ischemic nonhemorrhagic left thalamic infarct. 2. Underlying age-related cerebral atrophy with moderately advanced chronic microvascular ischemic disease, with small remote left cerebellar and right thalamic infarcts.   MRA HEAD:   Negative intracranial MRA for large vessel occlusion or other emergent finding. No hemodynamically significant or correctable stenosis.   MRA NECK:   Negative MRA of the neck. No hemodynamically significant stenosis or other acute vascular abnormality.   Tx: plavix 75mg  , ASA 81  Review of Systems: As per HPI otherwise 10 point review of systems negative.   Past Medical History:  Diagnosis Date   DVT (deep venous thrombosis) (HCC)    GERD (gastroesophageal reflux disease)    Gout    High cholesterol    Hypertension    Neuropathy     Past Surgical History:  Procedure Laterality Date   ABDOMINAL HYSTERECTOMY     JOINT REPLACEMENT       reports that she has never smoked. She has  never used smokeless tobacco. She reports that she does not drink alcohol. No history on file for drug use.  Allergies  Allergen Reactions   Ace Inhibitors Other (See Comments)    Cramping    Lipitor [Atorvastatin] Other (See Comments)    Cramping     History reviewed. No pertinent family history.  Prior to Admission medications   Medication Sig Start Date End Date Taking? Authorizing Provider  ezetimibe (ZETIA) 10 MG tablet Take 10 mg by mouth daily. 08/28/23  Yes [provider]  FEROSUL 325 (65 Fe) MG tablet Take 325 mg by mouth daily with breakfast. 06/18/23  Yes [provider]  acetaminophen  (TYLENOL ) 500 MG tablet Take 1,000 mg by mouth every 6 (six) hours as needed for mild pain or moderate pain.    [provider]  amLODipine (NORVASC) 10 MG tablet Take 10 mg by mouth daily.    [provider]  aspirin  EC 81 MG tablet Take 81 mg by mouth daily.    [provider]  atenolol (TENORMIN) 100 MG tablet Take 100 mg by mouth daily.    [provider]  cephALEXin  (KEFLEX ) 250 MG capsule Take 1 capsule (250 mg total) by mouth 3 (three) times daily. 12/08/22   Sung, Jade J, MD  cholecalciferol (VITAMIN D3) 25 MCG (1000 UT) tablet Take 1,000 Units by mouth daily.    [provider]  gabapentin (NEURONTIN) 100 MG capsule Take 100 mg by mouth 3 (three) times  daily.    [provider]  indomethacin (INDOCIN) 50 MG capsule Take 50 mg by mouth 3 (three) times daily as needed.    [provider]  isosorbide mononitrate (IMDUR) 60 MG 24 hr tablet Take 60 mg by mouth daily.    [provider]  losartan (COZAAR) 100 MG tablet Take 100 mg by mouth daily.     [provider]  nitroGLYCERIN (NITROSTAT) 0.4 MG SL tablet Place 0.4 mg under the tongue as needed.    [provider]  omeprazole (PRILOSEC) 20 MG capsule Take 20 mg by mouth daily.    [provider]  rosuvastatin  (CRESTOR ) 20 MG  tablet Take 1 tablet (20 mg total) by mouth daily at 6 PM. 04/12/18 05/12/18  Barbaraann Bookbinder, MD    Physical Exam: Vitals:   10/13/23 1800 10/13/23 1924 10/13/23 2017 10/13/23 2055  BP: 132/64 (!) 166/78  (!) 130/118  Pulse:  66  65  Resp:  16  16  Temp:  98.2 F (36.8 C)    SpO2:  100%  99%  Weight:   120 kg   Height:   5' 4 (1.626 m)     Constitutional: NAD, calm, comfortable Vitals:   10/13/23 1800 10/13/23 1924 10/13/23 2017 10/13/23 2055  BP: 132/64 (!) 166/78  (!) 130/118  Pulse:  66  65  Resp:  16  16  Temp:  98.2 F (36.8 C)    SpO2:  100%  99%  Weight:   120 kg   Height:   5' 4 (1.626 m)    Eyes: PERRL, lids and conjunctivae normal ENMT: Mucous membranes are moist. Posterior pharynx clear of any exudate or lesions.Normal dentition.  Neck: normal, supple, no masses, no thyromegaly Respiratory: clear to auscultation bilaterally, no wheezing, no crackles. Normal respiratory effort. No accessory muscle use.  Cardiovascular: Regular rate and rhythm, no murmurs / rubs / gallops. No extremity edema. 2+ pedal pulses. No carotid bruits.  Abdomen: no tenderness, no masses palpated. No hepatosplenomegaly. Bowel sounds positive.  Musculoskeletal: no clubbing / cyanosis. No joint deformity upper and lower extremities. Good ROM, no contractures. Normal muscle tone.  Skin: no rashes, lesions, ulcers. No induration Neurologic: CN 2-12 grossly intact. Sensation intact, DTR normal. Strength 5/5 in all 4.  Psychiatric: Normal judgment and insight. Alert and oriented x 3. Normal mood.    Labs on Admission: I have personally reviewed following labs and imaging studies  CBC: No results for input(s): WBC, NEUTROABS, HGB, HCT, MCV, PLT in the last 168 hours. Basic Metabolic Panel: No results for input(s): NA, K, CL, CO2, GLUCOSE, BUN, CREATININE, CALCIUM , MG, PHOS in the last 168 hours. GFR: CrCl cannot be calculated (Patient's most recent lab result  is older than the maximum 21 days allowed.). Liver Function Tests: No results for input(s): AST, ALT, ALKPHOS, BILITOT, PROT, ALBUMIN in the last 168 hours. No results for input(s): LIPASE, AMYLASE in the last 168 hours. No results for input(s): AMMONIA in the last 168 hours. Coagulation Profile: No results for input(s): INR, PROTIME in the last 168 hours. Cardiac Enzymes: No results for input(s): CKTOTAL, CKMB, CKMBINDEX, TROPONINI in the last 168 hours. BNP (last 3 results) No results for input(s): PROBNP in the last 8760 hours. HbA1C: No results for input(s): HGBA1C in the last 72 hours. CBG: Recent Labs  Lab 10/13/23 1744  GLUCAP 88   Lipid Profile: No results for input(s): CHOL, HDL, LDLCALC, TRIG, CHOLHDL, LDLDIRECT in the last 72 hours. Thyroid   Function Tests: No results for input(s): TSH, T4TOTAL, FREET4, T3FREE, THYROIDAB in the last 72 hours. Anemia Panel: No results for input(s): VITAMINB12, FOLATE, FERRITIN, TIBC, IRON, RETICCTPCT in the last 72 hours. Urine analysis:    Component Value Date/Time   COLORURINE STRAW (A) 12/13/2022 1002   APPEARANCEUR CLEAR (A) 12/13/2022 1002   LABSPEC 1.005 12/13/2022 1002   PHURINE 5.0 12/13/2022 1002   GLUCOSEU NEGATIVE 12/13/2022 1002   HGBUR NEGATIVE 12/13/2022 1002   BILIRUBINUR NEGATIVE 12/13/2022 1002   KETONESUR NEGATIVE 12/13/2022 1002   PROTEINUR NEGATIVE 12/13/2022 1002   NITRITE NEGATIVE 12/13/2022 1002   LEUKOCYTESUR MODERATE (A) 12/13/2022 1002    Radiological Exams on Admission: MR BRAIN WO CONTRAST Result Date: 10/13/2023 CLINICAL DATA:  Initial evaluation for acute neuro deficit, stroke suspected. EXAM: MRI HEAD WITHOUT CONTRAST MRA HEAD WITHOUT CONTRAST MRA NECK WITHOUT CONTRAST TECHNIQUE: Multiplanar, multiecho pulse sequences of the brain and surrounding structures were obtained without intravenous contrast. Angiographic images of the Circle  of Willis were obtained using MRA technique without intravenous contrast. Angiographic images of the neck were obtained using MRA technique without intravenous contrast. Carotid stenosis measurements (when applicable) are obtained utilizing NASCET criteria, using the distal internal carotid diameter as the denominator. COMPARISON:  Comparison made with prior CT from earlier the same day. FINDINGS: MRI HEAD FINDINGS Brain: Generalized age-related cerebral atrophy. Patchy T2/FLAIR hyperintensity involving the periventricular deep white matter both cerebral hemispheres, consistent chronic small vessel ischemic disease, moderately advanced in nature. Small remote left cerebellar infarct noted. Chronic lacunar infarct at the right thalamus. 1 cm acute ischemic nonhemorrhagic infarcts seen involving the medial left thalamus. No significant mass effect. No other acute or subacute ischemia. No acute intracranial hemorrhage. Few punctate chronic micro hemorrhages noted, likely small vessel related. No mass lesion, midline shift or mass effect. No hydrocephalus or extra-axial fluid collection. Pituitary gland within normal limits. Vascular: Major intracranial vascular flow voids are maintained. Skull and upper cervical spine: Craniocervical junction within normal limits. Bone marrow signal intensity within normal limits. No scalp soft tissue abnormality. Sinuses/Orbits: Prior bilateral ocular lens replacement. Paranasal sinuses are largely clear. Small right mastoid effusion noted, of doubtful significance. Other: None. MRA HEAD FINDINGS ANTERIOR CIRCULATION: Examination mildly degraded by motion artifact. Both internal carotid arteries are patent through the siphons without stenosis or other abnormality. A1 segments patent bilaterally. Normal anterior communicating artery complex. Anterior cerebral arteries patent without stenosis. No M1 stenosis or occlusion. MCA branches perfused and fairly symmetric. POSTERIOR  CIRCULATION: Vertebral arteries are largely code dominant and patent without stenosis. Right PICA patent. Left PICA origin not seen. Basilar patent without stenosis. Superior cerebellar and posterior cerebral arteries patent bilaterally. No intracranial aneurysm. MRA NECK FINDINGS AORTIC ARCH: Examination technically limited by motion and lack of IV contrast. Aortic arch and origin the great vessels not well assessed on this exam. RIGHT CAROTID SYSTEM: Visualized right common and internal carotid arteries are patent with antegrade flow. No visible dissection. No hemodynamically significant stenosis about the right carotid artery system. LEFT CAROTID SYSTEM: Visualized left common and internal carotid arteries are patent with antegrade flow. No visible dissection. No hemodynamically significant stenosis about the left carotid artery system. VERTEBRAL ARTERIES: Both vertebral arteries appear to arise from the subclavian arteries. Vertebral arteries are patent with antegrade flow. No visible dissection or stenosis. Other: None. IMPRESSION: MRI HEAD: 1. 1 cm acute ischemic nonhemorrhagic left thalamic infarct. 2. Underlying age-related cerebral atrophy with moderately advanced chronic microvascular ischemic disease, with small remote left cerebellar  and right thalamic infarcts. MRA HEAD: Negative intracranial MRA for large vessel occlusion or other emergent finding. No hemodynamically significant or correctable stenosis. MRA NECK: Negative MRA of the neck. No hemodynamically significant stenosis or other acute vascular abnormality. Electronically Signed   By: Virgia Griffins M.D.   On: 10/13/2023 19:43   MR ANGIO HEAD WO CONTRAST Result Date: 10/13/2023 CLINICAL DATA:  Initial evaluation for acute neuro deficit, stroke suspected. EXAM: MRI HEAD WITHOUT CONTRAST MRA HEAD WITHOUT CONTRAST MRA NECK WITHOUT CONTRAST TECHNIQUE: Multiplanar, multiecho pulse sequences of the brain and surrounding structures were  obtained without intravenous contrast. Angiographic images of the Circle of Willis were obtained using MRA technique without intravenous contrast. Angiographic images of the neck were obtained using MRA technique without intravenous contrast. Carotid stenosis measurements (when applicable) are obtained utilizing NASCET criteria, using the distal internal carotid diameter as the denominator. COMPARISON:  Comparison made with prior CT from earlier the same day. FINDINGS: MRI HEAD FINDINGS Brain: Generalized age-related cerebral atrophy. Patchy T2/FLAIR hyperintensity involving the periventricular deep white matter both cerebral hemispheres, consistent chronic small vessel ischemic disease, moderately advanced in nature. Small remote left cerebellar infarct noted. Chronic lacunar infarct at the right thalamus. 1 cm acute ischemic nonhemorrhagic infarcts seen involving the medial left thalamus. No significant mass effect. No other acute or subacute ischemia. No acute intracranial hemorrhage. Few punctate chronic micro hemorrhages noted, likely small vessel related. No mass lesion, midline shift or mass effect. No hydrocephalus or extra-axial fluid collection. Pituitary gland within normal limits. Vascular: Major intracranial vascular flow voids are maintained. Skull and upper cervical spine: Craniocervical junction within normal limits. Bone marrow signal intensity within normal limits. No scalp soft tissue abnormality. Sinuses/Orbits: Prior bilateral ocular lens replacement. Paranasal sinuses are largely clear. Small right mastoid effusion noted, of doubtful significance. Other: None. MRA HEAD FINDINGS ANTERIOR CIRCULATION: Examination mildly degraded by motion artifact. Both internal carotid arteries are patent through the siphons without stenosis or other abnormality. A1 segments patent bilaterally. Normal anterior communicating artery complex. Anterior cerebral arteries patent without stenosis. No M1 stenosis or  occlusion. MCA branches perfused and fairly symmetric. POSTERIOR CIRCULATION: Vertebral arteries are largely code dominant and patent without stenosis. Right PICA patent. Left PICA origin not seen. Basilar patent without stenosis. Superior cerebellar and posterior cerebral arteries patent bilaterally. No intracranial aneurysm. MRA NECK FINDINGS AORTIC ARCH: Examination technically limited by motion and lack of IV contrast. Aortic arch and origin the great vessels not well assessed on this exam. RIGHT CAROTID SYSTEM: Visualized right common and internal carotid arteries are patent with antegrade flow. No visible dissection. No hemodynamically significant stenosis about the right carotid artery system. LEFT CAROTID SYSTEM: Visualized left common and internal carotid arteries are patent with antegrade flow. No visible dissection. No hemodynamically significant stenosis about the left carotid artery system. VERTEBRAL ARTERIES: Both vertebral arteries appear to arise from the subclavian arteries. Vertebral arteries are patent with antegrade flow. No visible dissection or stenosis. Other: None. IMPRESSION: MRI HEAD: 1. 1 cm acute ischemic nonhemorrhagic left thalamic infarct. 2. Underlying age-related cerebral atrophy with moderately advanced chronic microvascular ischemic disease, with small remote left cerebellar and right thalamic infarcts. MRA HEAD: Negative intracranial MRA for large vessel occlusion or other emergent finding. No hemodynamically significant or correctable stenosis. MRA NECK: Negative MRA of the neck. No hemodynamically significant stenosis or other acute vascular abnormality. Electronically Signed   By: Virgia Griffins M.D.   On: 10/13/2023 19:43   MR ANGIO NECK WO CONTRAST Result  Date: 10/13/2023 CLINICAL DATA:  Initial evaluation for acute neuro deficit, stroke suspected. EXAM: MRI HEAD WITHOUT CONTRAST MRA HEAD WITHOUT CONTRAST MRA NECK WITHOUT CONTRAST TECHNIQUE: Multiplanar, multiecho  pulse sequences of the brain and surrounding structures were obtained without intravenous contrast. Angiographic images of the Circle of Willis were obtained using MRA technique without intravenous contrast. Angiographic images of the neck were obtained using MRA technique without intravenous contrast. Carotid stenosis measurements (when applicable) are obtained utilizing NASCET criteria, using the distal internal carotid diameter as the denominator. COMPARISON:  Comparison made with prior CT from earlier the same day. FINDINGS: MRI HEAD FINDINGS Brain: Generalized age-related cerebral atrophy. Patchy T2/FLAIR hyperintensity involving the periventricular deep white matter both cerebral hemispheres, consistent chronic small vessel ischemic disease, moderately advanced in nature. Small remote left cerebellar infarct noted. Chronic lacunar infarct at the right thalamus. 1 cm acute ischemic nonhemorrhagic infarcts seen involving the medial left thalamus. No significant mass effect. No other acute or subacute ischemia. No acute intracranial hemorrhage. Few punctate chronic micro hemorrhages noted, likely small vessel related. No mass lesion, midline shift or mass effect. No hydrocephalus or extra-axial fluid collection. Pituitary gland within normal limits. Vascular: Major intracranial vascular flow voids are maintained. Skull and upper cervical spine: Craniocervical junction within normal limits. Bone marrow signal intensity within normal limits. No scalp soft tissue abnormality. Sinuses/Orbits: Prior bilateral ocular lens replacement. Paranasal sinuses are largely clear. Small right mastoid effusion noted, of doubtful significance. Other: None. MRA HEAD FINDINGS ANTERIOR CIRCULATION: Examination mildly degraded by motion artifact. Both internal carotid arteries are patent through the siphons without stenosis or other abnormality. A1 segments patent bilaterally. Normal anterior communicating artery complex. Anterior  cerebral arteries patent without stenosis. No M1 stenosis or occlusion. MCA branches perfused and fairly symmetric. POSTERIOR CIRCULATION: Vertebral arteries are largely code dominant and patent without stenosis. Right PICA patent. Left PICA origin not seen. Basilar patent without stenosis. Superior cerebellar and posterior cerebral arteries patent bilaterally. No intracranial aneurysm. MRA NECK FINDINGS AORTIC ARCH: Examination technically limited by motion and lack of IV contrast. Aortic arch and origin the great vessels not well assessed on this exam. RIGHT CAROTID SYSTEM: Visualized right common and internal carotid arteries are patent with antegrade flow. No visible dissection. No hemodynamically significant stenosis about the right carotid artery system. LEFT CAROTID SYSTEM: Visualized left common and internal carotid arteries are patent with antegrade flow. No visible dissection. No hemodynamically significant stenosis about the left carotid artery system. VERTEBRAL ARTERIES: Both vertebral arteries appear to arise from the subclavian arteries. Vertebral arteries are patent with antegrade flow. No visible dissection or stenosis. Other: None. IMPRESSION: MRI HEAD: 1. 1 cm acute ischemic nonhemorrhagic left thalamic infarct. 2. Underlying age-related cerebral atrophy with moderately advanced chronic microvascular ischemic disease, with small remote left cerebellar and right thalamic infarcts. MRA HEAD: Negative intracranial MRA for large vessel occlusion or other emergent finding. No hemodynamically significant or correctable stenosis. MRA NECK: Negative MRA of the neck. No hemodynamically significant stenosis or other acute vascular abnormality. Electronically Signed   By: Virgia Griffins M.D.   On: 10/13/2023 19:43   CT HEAD CODE STROKE WO CONTRAST Result Date: 10/13/2023 CLINICAL DATA:  Code stroke. Neuro deficit, acute, stroke suspected. Right-sided weakness, slurred speech, and altered mental  status. EXAM: CT HEAD WITHOUT CONTRAST TECHNIQUE: Contiguous axial images were obtained from the base of the skull through the vertex without intravenous contrast. RADIATION DOSE REDUCTION: This exam was performed according to the departmental dose-optimization program which includes automated  exposure control, adjustment of the mA and/or kV according to patient size and/or use of iterative reconstruction technique. COMPARISON:  Head MRI 12/13/2022 FINDINGS: Brain: There is no evidence of an acute infarct, intracranial hemorrhage, mass, midline shift, or extra-axial fluid collection. Mild cerebral atrophy is within limits for age. Patchy cerebral white matter hypodensities are nonspecific but compatible with mild-to-moderate chronic small vessel ischemic disease. A small chronic left cerebellar infarct is new. Vascular: No hyperdense vessel. Skull: No acute fracture or suspicious lesion. Sinuses/Orbits: Mild mucosal thickening/small mucous retention cyst inferiorly in the right maxillary sinus. Chronic left mastoid effusion. Bilateral cataract extraction. Other: None. ASPECTS ([place] Stroke Program Early CT Score) - Ganglionic level infarction (caudate, lentiform nuclei, internal capsule, insula, M1-M3 cortex): 7 - Supraganglionic infarction (M4-M6 cortex): 3 Total score (0-10 with 10 being normal): 10 These results were communicated to Dr. Lindzen at 6:01 pm on 10/13/2023 by text page via the [facility] messaging system. IMPRESSION: 1. No evidence of acute intracranial abnormality. ASPECTS of 10. 2. Mild-to-moderate chronic small vessel ischemic disease. 3. Interval chronic left cerebellar infarct. Electronically Signed   By: Aundra Lee M.D.   On: 10/13/2023 18:01    EKG: Independently reviewed.   Assessment/Plan Acute ischemic nonhemorrhagic left thalamic infarct -admit tia/cva r/o protocol -A1c/HLD pending -Permissive HTN x48 hours from sx onset  -goal BP < 220/110, PRN above these parameters - ASA 81mg   daily + plavix 75mg  daily x21 days f/b ASA 81mg  daily monotherapy after that -neuro checks , SLP, PT/OT  -await final neuro recs    HLD -continue with statin and zetia   GERD -ppi  Gout -continue allopurinol    Essential Hypertension -continue on permissive HTN x 48 hours -resume home regimen there after as able     Neuropathy ,nos  - continue gabapentin  DVT -s/p tx  DVT prophylaxis:  heparin Code Status: full/ as discussed per patient wishes in event of cardiac arrest  Family Communication: none at bedside Disposition Plan: patient  expected to be admitted greater than 2 midnights  Consults called: Neurology Dr Renaee Caro Admission status: progressive care   Sabas Cradle MD [facility]   If 7PM-7AM, please contact night-coverage [email or web address] Password [facility]  10/13/2023, 9:46 PM

## 2023-10-13 NOTE — ED Provider Notes (Signed)
 Emergency Department Provider Note   I have reviewed the triage vital signs and the nursing notes.   HISTORY  Chief Complaint No chief complaint on file.   HPI Destiny Ruiz is a 89 y.o. female with PMH reviewed presents to the ED with CVA symptoms. Activated as a CODE STROKE by EMS. LSN at 7:30AM. Family report finding the patient confused and slow to respond. The noted some right face droop and right arm/leg weakness. Patient complains of a HA but CVA symptoms limit history. Level 5 caveat applies. She is normally independent and A&O x 4.    Past Medical History:  Diagnosis Date   DVT (deep venous thrombosis) (HCC)    GERD (gastroesophageal reflux disease)    Gout    High cholesterol    Hypertension    Neuropathy     Review of Systems  Level 5 caveat: CVA symptoms.  ____________________________________________   PHYSICAL EXAM:  VITAL SIGNS: ED Triage Vitals [10/13/23 1700]  Encounter Vitals Group     BP      Girls Systolic BP Percentile      Girls Diastolic BP Percentile      Boys Systolic BP Percentile      Boys Diastolic BP Percentile      Pulse      Resp      Temp      Temp src      SpO2      Weight 264 lb 8.8 oz (120 kg)     Height      Head Circumference      Peak Flow      Pain Score      Pain Loc      Pain Education      Exclude from Growth Chart    {** Revise as appropriate then delete this line - 8 systems required **} Constitutional: Alert and oriented. Well appearing and in no acute distress. Eyes: Conjunctivae are normal. PERRL. EOMI. Head: Atraumatic. {**Ears:  Healthy appearing ear canals and TMs bilaterally **}Nose: No congestion/rhinnorhea. Mouth/Throat: Mucous membranes are moist.  Oropharynx non-erythematous. Neck: No stridor.  No meningeal signs.  {**No cervical spine tenderness to palpation.**} Cardiovascular: Normal rate, regular rhythm. Good peripheral circulation. Grossly normal heart sounds.   Respiratory: Normal  respiratory effort.  No retractions. Lungs CTAB. Gastrointestinal: Soft and nontender. No distention.  {**Genitourinary:  **}Musculoskeletal: No lower extremity tenderness nor edema. No gross deformities of extremities. Neurologic:  Normal speech and language. No gross focal neurologic deficits are appreciated.  Skin:  Skin is warm, dry and intact. No rash noted. {**Psychiatric: Mood and affect are normal. Speech and behavior are normal.**}  ____________________________________________   LABS (all labs ordered are listed, but only abnormal results are displayed)  Labs Reviewed  PROTIME-INR  APTT  CBC  DIFFERENTIAL  COMPREHENSIVE METABOLIC PANEL WITH GFR  ETHANOL  CBG MONITORING, ED  CBG MONITORING, ED  I-STAT CHEM 8, ED   ____________________________________________  EKG  *** ____________________________________________  RADIOLOGY  CT HEAD CODE STROKE WO CONTRAST Result Date: 10/13/2023 CLINICAL DATA:  Code stroke. Neuro deficit, acute, stroke suspected. Right-sided weakness, slurred speech, and altered mental status. EXAM: CT HEAD WITHOUT CONTRAST TECHNIQUE: Contiguous axial images were obtained from the base of the skull through the vertex without intravenous contrast. RADIATION DOSE REDUCTION: This exam was performed according to the departmental dose-optimization program which includes automated exposure control, adjustment of the mA and/or kV according to patient size and/or use of iterative reconstruction technique. COMPARISON:  Head MRI 12/13/2022 FINDINGS: Brain: There is no evidence of an acute infarct, intracranial hemorrhage, mass, midline shift, or extra-axial fluid collection. Mild cerebral atrophy is within limits for age. Patchy cerebral white matter hypodensities are nonspecific but compatible with mild-to-moderate chronic small vessel ischemic disease. A small chronic left cerebellar infarct is new. Vascular: No hyperdense vessel. Skull: No acute fracture or  suspicious lesion. Sinuses/Orbits: Mild mucosal thickening/small mucous retention cyst inferiorly in the right maxillary sinus. Chronic left mastoid effusion. Bilateral cataract extraction. Other: None. ASPECTS (Alberta Stroke Program Early CT Score) - Ganglionic level infarction (caudate, lentiform nuclei, internal capsule, insula, M1-M3 cortex): 7 - Supraganglionic infarction (M4-M6 cortex): 3 Total score (0-10 with 10 being normal): 10 These results were communicated to Dr. Lindzen at 6:01 pm on 10/13/2023 by text page via the [facility] messaging system. IMPRESSION: 1. No evidence of acute intracranial abnormality. ASPECTS of 10. 2. Mild-to-moderate chronic small vessel ischemic disease. 3. Interval chronic left cerebellar infarct. Electronically Signed   By: Aundra Lee M.D.   On: 10/13/2023 18:01    ____________________________________________   PROCEDURES  Procedure(s) performed:   Procedures   ____________________________________________   INITIAL IMPRESSION / ASSESSMENT AND PLAN / ED COURSE  Pertinent labs & imaging results that were available during my care of the patient were reviewed by me and considered in my medical decision making (see chart for details).   This patient is Presenting for Evaluation of AMS, which does require a range of treatment options, and is a complaint that involves a high risk of morbidity and mortality.  The Differential Diagnoses includes but is not exclusive to alcohol, illicit or prescription medications, intracranial pathology such as stroke, intracerebral hemorrhage, fever or infectious causes including sepsis, hypoxemia, uremia, trauma, endocrine related disorders such as diabetes, hypoglycemia, thyroid -related diseases, etc.   Critical Interventions-    Medications  sodium chloride flush (NS) 0.9 % injection 3 mL (has no administration in time range)    Reassessment after intervention:     I did obtain Additional Historical Information from  EMS.  Clinical Laboratory Tests Ordered, included   Radiologic Tests Ordered, included ***. I independently interpreted the images and agree with radiology interpretation.   Cardiac Monitor Tracing which shows ***   Social Determinants of Health Risk ***  Consult complete with Neurology, Dr. Lindzen. Patient taken emergently to CT/MRI.   Medical Decision Making: Summary:  Patient arrives to the ED as a CODE STROKE. No acute findings on CT head. IV access difficult and decision made to take patient for MRI/MRA head/neck emergently. No immediate airway concerns.   Reevaluation with update and discussion with   ***Considered admission***  Patient's presentation is most consistent with acute presentation with potential threat to life or bodily function.   Disposition:   ____________________________________________  FINAL CLINICAL IMPRESSION(S) / ED DIAGNOSES  Final diagnoses:  None     NEW OUTPATIENT MEDICATIONS STARTED DURING THIS VISIT:  New Prescriptions   No medications on file    Note:  This document was prepared using Dragon voice recognition software and may include unintentional dictation errors.  Abby Hocking, MD, [facility]

## 2023-10-13 NOTE — Code Documentation (Signed)
 Stroke Response Nurse Documentation Code Documentation  Destiny Ruiz is a 89 y.o. female arriving to [facility]  via [facility] as Code Stroke Activation. Patient LKW 0730. When family called 1530, noted not to be at baseline and arrived 1630 and EMS was dispatched.   Stroke team met pt at bridge upon arrival. Unable to draw labs due to difficult stick. Cleared for CT by EDP Long. NIH 13, see flowsheet for details. CT head completed. IV attempted with ultrasound, unable to obtain. Decision made to go to MRI/MRA. OOW for TNK. No thrombectomy due to negative imaging. Care Plan: q2h NIH/vitals x12 then per unit routine. Process Delays Noted: IV access, unable to obtain. Decision to take to MRI. Bedside handoff with ED Dylan.    Ronney Cola K  Rapid Response RN

## 2023-10-13 NOTE — Consult Note (Signed)
 NEUROLOGY CONSULT NOTE   Date of service: October 13, 2023 Patient Name: Destiny Ruiz MRN:  [id number] DOB:  May 14, 1930 Chief Complaint: Right sided weakness and AMS Requesting Provider: No att. providers found  History of Present Illness  Destiny Ruiz is a 89 y.o. female with a PMHx of HTN, HLD, stroke and obesity who presents with acute onset right sided weakness and AMS.  She spoke with family on the phone at 0730 and seemed to be in her normal state of health.  Her family called her again at 1530 and noted that she was confused.  They came to check on her at 1630, found her to have right sided weakness with AMS and called EMS.  Patient is currently in rehabilitation for a previous stroke but has no deficits per family.  She lives independently in an apartment for senior citizens.  Home medications include ASA and rosuvastatin .   LKW: 0730 Modified rankin score: 1-No significant post stroke disability and can perform usual duties with stroke symptoms IV Thrombolysis: No, outside of window EVT: No, completed stroke on MRI  NIHSS components Score: Comment  1a Level of Conscious 0[x]  1[]  2[]  3[]      1b LOC Questions 0[x]  1[]  2[]       1c LOC Commands 0[x]  1[]  2[]       2 Best Gaze 0[x]  1[]  2[]       3 Visual 0[x]  1[]  2[]  3[]      4 Facial Palsy 0[]  1[x]  2[]  3[]      5a Motor Arm - left 0[x]  1[]  2[]  3[]  4[]  UN[]    5b Motor Arm - Right 0[]  1[]  2[]  3[]  4[x]  UN[]    6a Motor Leg - Left 0[]  1[]  2[]  3[x]  4[]  UN[]    6b Motor Leg - Right 0[]  1[]  2[]  3[x]  4[]  UN[]    7 Limb Ataxia 0[x]  1[]  2[]  UN[]      8 Sensory 0[x]  1[]  2[]  UN[]      9 Best Language 0[x]  1[]  2[]  3[]      10 Dysarthria 0[]  1[x]  2[]  UN[]      11 Extinct. and Inattention 0[]  1[x]  2[]       TOTAL:13       ROS   As per HPI. Comprehensive ROS deferred in the context of acuity of presentation.   Past History   Past Medical History:  Diagnosis Date   DVT (deep venous thrombosis) (HCC)    GERD (gastroesophageal reflux  disease)    Gout    High cholesterol    Hypertension    Neuropathy     Past Surgical History:  Procedure Laterality Date   ABDOMINAL HYSTERECTOMY     JOINT REPLACEMENT      Family History: No family history on file.  Social History  reports that she has never smoked. She has never used smokeless tobacco. She reports that she does not drink alcohol. No history on file for drug use.  Allergies  Allergen Reactions   Ace Inhibitors Other (See Comments)    Cramping    Lipitor [Atorvastatin] Other (See Comments)    Cramping     Medications  No current facility-administered medications for this encounter.  Current Outpatient Medications:    acetaminophen  (TYLENOL ) 500 MG tablet, Take 1,000 mg by mouth every 6 (six) hours as needed for mild pain or moderate pain., Disp: , Rfl:    amLODipine (NORVASC) 10 MG tablet, Take 10 mg by mouth daily., Disp: , Rfl:    aspirin  EC 81 MG tablet, Take 81 mg by mouth  daily., Disp: , Rfl:    atenolol (TENORMIN) 100 MG tablet, Take 100 mg by mouth daily., Disp: , Rfl:    cephALEXin  (KEFLEX ) 250 MG capsule, Take 1 capsule (250 mg total) by mouth 3 (three) times daily., Disp: 21 capsule, Rfl: 0   cholecalciferol (VITAMIN D3) 25 MCG (1000 UT) tablet, Take 1,000 Units by mouth daily., Disp: , Rfl:    gabapentin (NEURONTIN) 100 MG capsule, Take 100 mg by mouth 3 (three) times daily., Disp: , Rfl:    indomethacin (INDOCIN) 50 MG capsule, Take 50 mg by mouth 3 (three) times daily as needed., Disp: , Rfl:    isosorbide mononitrate (IMDUR) 60 MG 24 hr tablet, Take 60 mg by mouth daily., Disp: , Rfl:    losartan (COZAAR) 100 MG tablet, Take 100 mg by mouth daily. , Disp: , Rfl:    nitroGLYCERIN (NITROSTAT) 0.4 MG SL tablet, Place 0.4 mg under the tongue as needed., Disp: , Rfl:    omeprazole (PRILOSEC) 20 MG capsule, Take 20 mg by mouth daily., Disp: , Rfl:    rosuvastatin  (CRESTOR ) 20 MG tablet, Take 1 tablet (20 mg total) by mouth daily at 6 PM., Disp: 30  tablet, Rfl: 0  Vitals   Vitals:   10/13/23 1700  Weight: 120 kg    Body mass index is 45.41 kg/m.   Physical Exam   Constitutional: Ill-appearing elderly patient in no acute distress Psych: Affect blunted Eyes: No scleral injection.  HENT: No OP obstruction.  Head: Normocephalic.  Respiratory: Effort normal, non-labored breathing.  Skin: WDI.   Neurologic Examination    NEURO:  Mental Status: AA&Ox3 but unable to give much history.  Nearly nonverbal on arrival but improved over the next few minutes Speech/Language: speech is with some dysarthria but no aphasia. Naming, repetition, fluency, and comprehension intact.  Cranial Nerves:  II: PERRL. Visual fields full.  III, IV, VI: EOMI. Eyelids elevate symmetrically.  V: Sensation is intact to light touch and symmetrical to face.  VII: Subtle right facial droop VIII: hearing intact to voice. IX, X: Phonation is normal.  XII: tongue is midline without fasciculations. Motor: Able to move left upper extremity with antigravity strength.  Unable to move right upper extremity.  Moves bilateral lower extremities but does not lift them off the bed Tone: is normal and bulk is normal Sensation- Intact to light touch bilaterally. Right sided extinction present Coordination: Unable to perform on the right. No left sided ataxia.  Gait- deferred     Labs/Imaging/Neurodiagnostic studies   CBC: No results for input(s): WBC, NEUTROABS, HGB, HCT, MCV, PLT in the last 168 hours. Basic Metabolic Panel:  Lab Results  Component Value Date   NA 138 12/07/2022   K 4.4 12/07/2022   CO2 27 12/07/2022   GLUCOSE 112 (H) 12/07/2022   BUN 18 12/07/2022   CREATININE 1.05 (H) 12/07/2022   CALCIUM  8.4 (L) 12/07/2022   GFRNONAA 50 (L) 12/07/2022   GFRAA >60 04/11/2018   Lipid Panel:  Lab Results  Component Value Date   LDLCALC 45 04/12/2018   HgbA1c:  Lab Results  Component Value Date   HGBA1C 5.9 (H) 04/12/2018   Urine  Drug Screen:     Component Value Date/Time   LABOPIA NONE DETECTED 04/11/2018 1425   COCAINSCRNUR NONE DETECTED 04/11/2018 1425   LABBENZ NONE DETECTED 04/11/2018 1425   AMPHETMU NONE DETECTED 04/11/2018 1425   THCU NONE DETECTED 04/11/2018 1425   LABBARB NONE DETECTED 04/11/2018 1425    Alcohol  Level     Component Value Date/Time   ETH <10 04/11/2018 1055   INR  Lab Results  Component Value Date   INR 0.98 04/11/2018   APTT  Lab Results  Component Value Date   APTT 27 04/11/2018   AED levels: No results found for: PHENYTOIN, ZONISAMIDE, LAMOTRIGINE, LEVETIRACETA  CT Head without contrast(Personally reviewed): No acute abnormality.  Old left cerebellar infarct  CT angio Head and Neck with contrast with perfusion: Unable to obtain as IV access could not be obtained  MR Angio head: Negative intracranial MRA for large vessel occlusion or other emergent finding. No hemodynamically significant or correctable stenosis.  MRA neck:  Negative MRA of the neck. No hemodynamically significant stenosis or other acute vascular abnormality.   MRI Brain (Personally reviewed): 1. 1 cm acute ischemic nonhemorrhagic left thalamic infarct. 2. Underlying age-related cerebral atrophy with moderately advanced chronic microvascular ischemic disease, with small remote left cerebellar and right thalamic infarcts.    ASSESSMENT   Destiny Ruiz is a 89 y.o. female  with hx of HTN, HLD, stroke and obesity who presents with acute onset altered mental status and right sided weakness. She was nearly nonverbal on arrival but did improve over the next 30 minutes, becoming oriented to place, time and situation.  NIHSS 13. Initial head CT was negative for acute abnormality.  CTA head and neck with perfusion could not be obtained as IV access could not be obtained.  She was taken for STAT MRI/MRA.  DWI demonstrates acute ischemic stroke in left thalamus.  No LVO on MRA of head and neck.  Will admit patient for full stroke workup.  RECOMMENDATIONS  Stroke/TIA Workup  - Admit for stroke workup - Permissive HTN x48 hrs from sx onset goal BP <220/110. PRN labetalol or hydralazine if BP above these parameters. Avoid oral antihypertensives. - TTE w/ bubble - Check A1c and LDL  - Continue her statin - aspirin  81 mg daily and plavix 75 mg daily antiplt/anticoag - q4 hr neuro checks - STAT head CT for any change in neuro exam - Tele - PT/OT/SLP - Stroke education - Amb referral to neurology upon discharge   - Stroke Team to follow ______________________________________________________________________  Patient seen by NP with MD.  Signed, Cortney E Bucky Cardinal, NP [facility]   I have seen and examined the patient. I have formulated the assessment and recommendations. 89 y.o. female  with hx of HTN, HLD, stroke and obesity who presents with acute onset altered mental status and right sided weakness. She was nearly nonverbal on arrival but did improve over the next 30 minutes, becoming oriented to place, time and situation.  NIHSS 13. Initial head CT was negative for acute abnormality.  CTA head and neck with perfusion could not be obtained as IV access could not be obtained.  She was taken for STAT MRI/MRA.  DWI demonstrates acute ischemic stroke in left thalamus.  No LVO on MRA of head and neck. Will admit patient for full stroke workup. Electronically signed: Dr. Kerri-Anne Haeberle

## 2023-10-13 NOTE — ED Triage Notes (Addendum)
 BIB [facility]   C/c acute onset stroke like symptoms. LSN 0730 this morning. Pt is independent and lives in an apartment by herself. The patient is reported to normally be Aox4, GCS 15.   Family called 911 around 1630 when they found the patient not answering questions appropriately, with right sided facial droop, arm drift, and leg drift. Pt Aox2 with EMS, able to answer some questioning appropriately. Pt complaining of headache.   Pt has a history of stroke with no deficits at baseline.   No blood thinners. Pt reported to take daily ASA.

## 2023-10-14 ENCOUNTER — Inpatient Hospital Stay (HOSPITAL_COMMUNITY)

## 2023-10-14 DIAGNOSIS — I639 Cerebral infarction, unspecified: Secondary | ICD-10-CM

## 2023-10-14 DIAGNOSIS — I6389 Other cerebral infarction: Secondary | ICD-10-CM | POA: Diagnosis not present

## 2023-10-14 LAB — CBC
HCT: 37 % (ref 36.0–46.0)
HCT: 36.7 % (ref 36.0–46.0)
Hemoglobin: 11.4 g/dL — ABNORMAL LOW (ref 12.0–15.0)
Hemoglobin: 11.4 g/dL — ABNORMAL LOW (ref 12.0–15.0)
MCH: 27.2 pg (ref 26.0–34.0)
MCH: 26.9 pg (ref 26.0–34.0)
MCHC: 30.8 g/dL (ref 30.0–36.0)
MCHC: 31.1 g/dL (ref 30.0–36.0)
MCV: 88.3 fL (ref 80.0–100.0)
MCV: 86.6 fL (ref 80.0–100.0)
Platelets: 205 10*3/uL (ref 150–400)
Platelets: 173 10*3/uL (ref 150–400)
RBC: 4.19 MIL/uL (ref 3.87–5.11)
RBC: 4.24 MIL/uL (ref 3.87–5.11)
RDW: 15.5 % (ref 11.5–15.5)
RDW: 15.5 % (ref 11.5–15.5)
WBC: 6.3 10*3/uL (ref 4.0–10.5)
WBC: 6.6 10*3/uL (ref 4.0–10.5)
nRBC: 0 % (ref 0.0–0.2)
nRBC: 0 % (ref 0.0–0.2)

## 2023-10-14 LAB — BASIC METABOLIC PANEL WITH GFR
Anion gap: 9 (ref 5–15)
BUN: 14 mg/dL (ref 8–23)
CO2: 25 mmol/L (ref 22–32)
Calcium: 8.9 mg/dL (ref 8.9–10.3)
Chloride: 105 mmol/L (ref 98–111)
Creatinine, Ser: 1.05 mg/dL — ABNORMAL HIGH (ref 0.44–1.00)
GFR, Estimated: 50 mL/min — ABNORMAL LOW (ref >60)
Glucose, Bld: 110 mg/dL — ABNORMAL HIGH (ref 70–99)
Potassium: 4.5 mmol/L (ref 3.5–5.1)
Sodium: 139 mmol/L (ref 135–145)

## 2023-10-14 LAB — ECHOCARDIOGRAM COMPLETE
AR max vel: 2.86 cm2
AV Area VTI: 2.7 cm2
AV Area mean vel: 2.76 cm2
AV Mean grad: 5 mmHg
AV Peak grad: 9.9 mmHg
Ao pk vel: 1.57 m/s
Area-P 1/2: 3.08 cm2
Calc EF: 71.2 %
Height: 64 in
S' Lateral: 2.1 cm
Single Plane A2C EF: 69.6 %
Single Plane A4C EF: 69.3 %
Weight: 4232.83 [oz_av]

## 2023-10-14 LAB — LIPID PANEL
Cholesterol: 149 mg/dL (ref 0–200)
HDL: 37 mg/dL — ABNORMAL LOW (ref >40)
LDL Cholesterol: 87 mg/dL (ref 0–99)
Total CHOL/HDL Ratio: 4 ratio
Triglycerides: 127 mg/dL (ref <150)
VLDL: 25 mg/dL (ref 0–40)

## 2023-10-14 LAB — CREATININE, SERUM
Creatinine, Ser: 0.81 mg/dL (ref 0.44–1.00)
GFR, Estimated: >60 mL/min (ref >60)

## 2023-10-14 LAB — HEMOGLOBIN A1C
Hgb A1c MFr Bld: 6 % — ABNORMAL HIGH (ref 4.8–5.6)
Mean Plasma Glucose: 125.5 mg/dL

## 2023-10-14 MED ORDER — PNEUMOCOCCAL 20-VAL CONJ VACC 0.5 ML IM SUSY
0.5000 mL | PREFILLED_SYRINGE | INTRAMUSCULAR | Status: DC
  Filled 2023-10-14: qty 0.5

## 2023-10-14 MED ORDER — PERFLUTREN LIPID MICROSPHERE
1.0000 mL | INTRAVENOUS | Status: AC | PRN
  Administered 2023-10-14: 3 mL via INTRAVENOUS

## 2023-10-14 MED ORDER — ASPIRIN 81 MG PO CHEW: 81.0000 mg | CHEWABLE_TABLET | Freq: Every day | ORAL | Status: DC

## 2023-10-14 MED ORDER — ROSUVASTATIN CALCIUM 20 MG PO TABS
40.0000 mg | ORAL_TABLET | Freq: Every day | ORAL | Status: DC
  Administered 2023-10-14 – 2023-10-16 (×3): 40 mg via ORAL
  Filled 2023-10-14 (×3): qty 2

## 2023-10-14 NOTE — Progress Notes (Signed)
 PROGRESS NOTE    Destiny Ruiz  EXB:[id number] DOB: Feb 04, 1931 DOA: 10/13/2023 PCP: Destiny Decamp, MD  Chief Complaint  Patient presents with   Code Stroke    Brief Narrative:   Destiny Ruiz is Destiny Ruiz 89 y.o. female with medical history significant of DVT, GERD, Gout, Essential Hypertension, Neuropathy ,CVA,who presents to ED with complaint of acute onset of right sided facial droop, arm weakness and leg weakness, headache as well as confusion. Patient  LSW was 0730 this am and ambulance was called at 1630. Patient notes does not feel well, currently complaining of HA. She notes she still feel weak and she noted both arms feel weak. She notes no n/v/d /f/c/or abdominal pain. She notes no new weakness in her legs.   Assessment & Plan:   Principal Problem:   Acute CVA (cerebrovascular accident) (HCC)  Acute Stroke -MRI with 1 cm acute ischemic nonhemorrhagic L thalamic infarct -MRA without LVO or other emergent findings.  No hemodynamically significant or correctable stenosis.  Negative MRA of neck.  -A1c 6/LDL 87 -neurology suspects due to small vessel disease, recommending DAPTx3 weeks followed by plavix.  Crestor  40 mg.  - echo with EF 65-70%, no RWMA, grade II diastolic dysfunction (see report) - carotid US  with near normal carotids bilaterally - PT/OT/SLP recommending SNF   HLD -continue with statin and zetia    GERD -ppi   Gout -continue allopurinol     Essential Hypertension -continue on permissive HTN x 48 hours -resume home regimen there after as able     Neuropathy ,nos  - continue gabapentin   DVT -s/p tx    DVT prophylaxis: heparin  Code Status: full Family Communication: granddaughter Disposition:   Status is: Inpatient Remains inpatient appropriate because: need for inpatient care   Consultants:  neurology  Procedures:  Echo IMPRESSIONS     1. Left ventricular ejection fraction, by estimation, is 65 to 70%. The  left ventricle has  normal function. The left ventricle has no regional  wall motion abnormalities. There is mild left ventricular hypertrophy.  Left ventricular diastolic parameters  are consistent with Grade II diastolic dysfunction (pseudonormalization).  Elevated left atrial pressure. The average left ventricular global  longitudinal strain is -22.0 %. The global longitudinal strain is normal.   2. Right ventricular systolic function is normal. The right ventricular  size is mildly enlarged. Tricuspid regurgitation signal is inadequate for  assessing PA pressure.   3. Left atrial size was mildly dilated.   4. The mitral valve is normal in structure. Trivial mitral valve  regurgitation. No evidence of mitral stenosis.   5. The aortic valve is tricuspid. Aortic valve regurgitation is not  visualized. Aortic valve sclerosis is present, with no evidence of aortic  valve stenosis.   6. The inferior vena cava is normal in size with greater than 50%  respiratory variability, suggesting right atrial pressure of 3 mmHg.   Antimicrobials:  Anti-infectives (From admission, onward)    None       Subjective: No complaints  Objective: Vitals:   10/13/23 2303 10/14/23 0415 10/14/23 0803 10/14/23 1643  BP: (!) 161/87 (!) 168/84 (!) 161/88 (!) 162/84  Pulse: 62 (!) 54 (!) 55 63  Resp: 18 18 16 16   Temp: 98.2 F (36.8 C) (!) 97.4 F (36.3 C) 98.1 F (36.7 C) 99.2 F (37.3 C)  TempSrc: Oral Oral Oral Oral  SpO2: 98% 98% 99% 93%  Weight:      Height:  Intake/Output Summary (Last 24 hours) at 10/14/2023 1913 Last data filed at 10/14/2023 1700 Gross per 24 hour  Intake 1118.68 ml  Output 3450 ml  Net -2331.32 ml   Filed Weights   10/13/23 1700 10/13/23 2017  Weight: 120 kg 120 kg    Examination:  General exam: Appears calm and comfortable  Respiratory system: unlabored Cardiovascular system: RRR Gastrointestinal system: Abdomen is nondistended, soft and nontender. Central nervous  system: mild L sided weakness Extremities: chronic lymphedema    Data Reviewed: I have personally reviewed following labs and imaging studies  CBC: Recent Labs  Lab 10/13/23 2159 10/13/23 2221 10/14/23 0100 10/14/23 1520  WBC  --  6.7 6.3 6.6  NEUTROABS  --  4.3  --   --   HGB 13.3 11.7* 11.4* 11.4*  HCT 39.0 39.1 37.0 36.7  MCV  --  87.7 88.3 86.6  PLT  --  191 205 173    Basic Metabolic Panel: Recent Labs  Lab 10/13/23 2159 10/13/23 2221 10/14/23 0100 10/14/23 1520  NA 143 142  --  139  K 4.3 4.3  --  4.5  CL 103 106  --  105  CO2  --  26  --  25  GLUCOSE 87 94  --  110*  BUN 15 13  --  14  CREATININE 0.80 0.78 0.81 1.05*  CALCIUM   --  9.0  --  8.9    GFR: Estimated Creatinine Clearance: 45.4 mL/min (Destiny Ruiz) (by C-G formula based on SCr of 1.05 mg/dL (H)).  Liver Function Tests: Recent Labs  Lab 10/13/23 2221  AST 16  ALT 10  ALKPHOS 41  BILITOT 0.7  PROT 6.9  ALBUMIN 3.3*    CBG: Recent Labs  Lab 10/13/23 1744  GLUCAP 88     No results found for this or any previous visit (from the past 240 hours).       Radiology Studies: VAS US  CAROTID Result Date: 10/14/2023 Carotid Arterial Duplex Study Patient Name:  Destiny Ruiz  Date of Exam:   10/14/2023 Medical Rec #: [id number]          Accession #:    [id number] Date of Birth: 07/25/1930          Patient Gender: F Patient Age:   67 years Exam Location:  [facility] Procedure:      VAS US  CAROTID Referring Phys: Destiny Ruiz --------------------------------------------------------------------------------  Indications:       CVA, Weakness and Altered mental status. Risk Factors:      Hypertension, hyperlipidemia. Limitations        Today's exam was limited due to the high bifurcation of the                    carotid, the patient's respiratory variation and the body                    habitus of the patient. Comparison Study:  No prior study on file Performing Technologist: Destiny Ruiz  RVS  Examination Guidelines: Destiny Ruiz complete evaluation includes B-mode imaging, spectral Doppler, color Doppler, and power Doppler as needed of all accessible portions of each vessel. Bilateral testing is considered an integral part of Destiny Ruiz complete examination. Limited examinations for reoccurring indications may be performed as noted.  Right Carotid Findings: +----------+--------+--------+--------+------------------+--------+           PSV cm/sEDV cm/sStenosisPlaque DescriptionComments +----------+--------+--------+--------+------------------+--------+ CCA Prox  98      21                                         +----------+--------+--------+--------+------------------+--------+  CCA Distal101     13                                         +----------+--------+--------+--------+------------------+--------+ ICA Prox  74      15              calcific                   +----------+--------+--------+--------+------------------+--------+ ICA Mid   81      13                                         +----------+--------+--------+--------+------------------+--------+ ICA Distal72      15                                tortuous +----------+--------+--------+--------+------------------+--------+ ECA       94      5                                          +----------+--------+--------+--------+------------------+--------+ +----------+--------+-------+--------+-------------------+           PSV cm/sEDV cmsDescribeArm Pressure (mmHG) +----------+--------+-------+--------+-------------------+ [id number]                                        +----------+--------+-------+--------+-------------------+ +---------+--------+--+--------+--+ VertebralPSV cm/s92EDV cm/s17 +---------+--------+--+--------+--+  Left Carotid Findings: +----------+--------+--------+--------+------------------+--------+           PSV cm/sEDV cm/sStenosisPlaque DescriptionComments  +----------+--------+--------+--------+------------------+--------+ CCA Prox  67      15                                         +----------+--------+--------+--------+------------------+--------+ CCA Distal58      12                                         +----------+--------+--------+--------+------------------+--------+ ICA Prox  107     13                                tortuous +----------+--------+--------+--------+------------------+--------+ ICA Mid   88      23                                tortuous +----------+--------+--------+--------+------------------+--------+ ICA Distal59      10                                tortuous +----------+--------+--------+--------+------------------+--------+ ECA       111     2                                 tortuous +----------+--------+--------+--------+------------------+--------+ +----------+--------+--------+--------+-------------------+  PSV cm/sEDV cm/sDescribeArm Pressure (mmHG) +----------+--------+--------+--------+-------------------+ [id number]                                          +----------+--------+--------+--------+-------------------+ +---------+--------+---+--------+--+ VertebralPSV cm/s111EDV cm/s19 +---------+--------+---+--------+--+   Summary: Right Carotid: The extracranial vessels were near-normal with only minimal wall                thickening or plaque. Left Carotid: The extracranial vessels were near-normal with only minimal wall               thickening or plaque. Vertebrals:  Bilateral vertebral arteries demonstrate antegrade flow. Subclavians: Normal flow hemodynamics were seen in bilateral subclavian              arteries. *See table(s) above for measurements and observations.     Preliminary    ECHOCARDIOGRAM COMPLETE Result Date: 10/14/2023    ECHOCARDIOGRAM REPORT   Patient Name:   CALVIN CHURA Date of Exam: 10/14/2023 Medical Rec #:  [id number]          Height:       64.0 in Accession #:    [id number]        Weight:       264.6 lb Date of Birth:  [birth year]         BSA:          2.203 m Patient Age:    89 years          BP:           126/93 mmHg Patient Gender: F                 HR:           53 bpm. Exam Location:  Inpatient Procedure: 2D Echo, 3D Echo, Cardiac Doppler, Color Doppler, Strain Analysis and            Intracardiac Opacification Agent (Both Spectral and Color Flow            Doppler were utilized during procedure). Indications:    Stroke  History:        Patient has prior history of Echocardiogram examinations, most                 recent 04/12/2018. DVT; Risk Factors:Hypertension and                 Dyslipidemia. GERD.  Sonographer:    Travis Friedman RDCS Referring Phys: [id number] CORTNEY E DE LA Ruiz IMPRESSIONS  1. Left ventricular ejection fraction, by estimation, is 65 to 70%. The left ventricle has normal function. The left ventricle has no regional wall motion abnormalities. There is mild left ventricular hypertrophy. Left ventricular diastolic parameters are consistent with Grade II diastolic dysfunction (pseudonormalization). Elevated left atrial pressure. The average left ventricular global longitudinal strain is -22.0 %. The global longitudinal strain is normal.  2. Right ventricular systolic function is normal. The right ventricular size is mildly enlarged. Tricuspid regurgitation signal is inadequate for assessing PA pressure.  3. Left atrial size was mildly dilated.  4. The mitral valve is normal in structure. Trivial mitral valve regurgitation. No evidence of mitral stenosis.  5. The aortic valve is tricuspid. Aortic valve regurgitation is not visualized. Aortic valve sclerosis is present, with no evidence of aortic valve stenosis.  6. The inferior vena cava is normal in size with greater than 50% respiratory variability,  suggesting right atrial pressure of 3 mmHg. FINDINGS  Left Ventricle: Left ventricular ejection fraction, by estimation,  is 65 to 70%. The left ventricle has normal function. The left ventricle has no regional wall motion abnormalities. The average left ventricular global longitudinal strain is -22.0 %. Strain was performed and the global longitudinal strain is normal. 3D ejection fraction reviewed and evaluated as part of the interpretation. Alternate measurement of EF is felt to be most reflective of LV function. The left ventricular internal cavity size was normal in size. There is mild left ventricular hypertrophy. Left ventricular diastolic parameters are consistent with Grade II diastolic dysfunction (pseudonormalization). Elevated left atrial pressure. Right Ventricle: The right ventricular size is mildly enlarged. No increase in right ventricular wall thickness. Right ventricular systolic function is normal. Tricuspid regurgitation signal is inadequate for assessing PA pressure. Left Atrium: Left atrial size was mildly dilated. Right Atrium: Right atrial size was normal in size. Pericardium: Trivial pericardial effusion is present. Mitral Valve: The mitral valve is normal in structure. Trivial mitral valve regurgitation. No evidence of mitral valve stenosis. Tricuspid Valve: The tricuspid valve is normal in structure. Tricuspid valve regurgitation is trivial. Aortic Valve: The aortic valve is tricuspid. Aortic valve regurgitation is not visualized. Aortic valve sclerosis is present, with no evidence of aortic valve stenosis. Aortic valve mean gradient measures 5.0 mmHg. Aortic valve peak gradient measures 9.9  mmHg. Aortic valve area, by VTI measures 2.70 cm. Pulmonic Valve: The pulmonic valve was not well visualized. Pulmonic valve regurgitation is not visualized. Aorta: The aortic root and ascending aorta are structurally normal, with no evidence of dilitation. Venous: The inferior vena cava is normal in size with greater than 50% respiratory variability, suggesting right atrial pressure of 3 mmHg. IAS/Shunts: The  interatrial septum was not well visualized. Additional Comments: 3D was performed not requiring image post processing on an independent workstation and was normal.  LEFT VENTRICLE PLAX 2D LVIDd:         4.50 cm      Diastology LVIDs:         2.10 cm      LV e' medial:    6.85 cm/s LV PW:         1.00 cm      LV E/e' medial:  13.8 LV IVS:        0.90 cm      LV e' lateral:   6.85 cm/s LVOT diam:     2.30 cm      LV E/e' lateral: 13.8 LV SV:         113 LV SV Index:   51           2D Longitudinal Strain LVOT Area:     4.15 cm     2D Strain GLS Avg:     -22.0 %  LV Volumes (MOD) LV vol d, MOD A2C: 123.0 ml 3D Volume EF: LV vol d, MOD A4C: 161.0 ml 3D EF:        64 % LV vol s, MOD A2C: 37.4 ml  LV EDV:       116 ml LV vol s, MOD A4C: 49.4 ml  LV ESV:       42 ml LV SV MOD A2C:     85.6 ml  LV SV:        74 ml LV SV MOD A4C:     161.0 ml LV SV MOD BP:      107.7 ml RIGHT VENTRICLE  IVC RV Basal diam:  4.20 cm     IVC diam: 1.60 cm RV Mid diam:    2.10 cm RV S prime:     14.50 cm/s TAPSE (M-mode): 3.1 cm LEFT ATRIUM             Index        RIGHT ATRIUM           Index LA diam:        3.80 cm 1.72 cm/m   RA Area:     15.80 cm LA Vol (A2C):   89.2 ml 40.48 ml/m  RA Volume:   36.60 ml  16.61 ml/m LA Vol (A4C):   61.7 ml 28.00 ml/m LA Biplane Vol: 79.5 ml 36.08 ml/m  AORTIC VALVE AV Area (Vmax):    2.86 cm AV Area (Vmean):   2.76 cm AV Area (VTI):     2.70 cm AV Vmax:           157.00 cm/s AV Vmean:          105.000 cm/s AV VTI:            0.417 m AV Peak Grad:      9.9 mmHg AV Mean Grad:      5.0 mmHg LVOT Vmax:         108.00 cm/s LVOT Vmean:        69.700 cm/s LVOT VTI:          0.271 m LVOT/AV VTI ratio: 0.65  AORTA Ao Root diam: 2.80 cm Ao Asc diam:  3.40 cm MITRAL VALVE MV Area (PHT): 3.08 cm     SHUNTS MV Decel Time: 246 msec     Systemic VTI:  0.27 m MV E velocity: 94.20 cm/s   Systemic Diam: 2.30 cm MV Javia Dillow velocity: 101.00 cm/s MV E/Taia Bramlett ratio:  0.93 Carson Clara MD Electronically signed by  Carson Clara MD Signature Date/Time: 10/14/2023/12:58:49 PM    Final    DG CHEST PORT 1 VIEW Result Date: 10/13/2023 CLINICAL DATA:  Cerebrovascular accident EXAM: PORTABLE CHEST 1 VIEW COMPARISON:  04/11/2018 FINDINGS: Low lung volumes accentuate cardiac silhouette and pulmonary vascularity. Question basilar infiltrates. No pleural effusion or pneumothorax. No displaced rib fractures. IMPRESSION: Low lung volumes with basilar infiltrates versus atelectasis. Electronically Signed   By: Rozell Cornet M.D.   On: 10/13/2023 23:25   MR BRAIN WO CONTRAST Result Date: 10/13/2023 CLINICAL DATA:  Initial evaluation for acute neuro deficit, stroke suspected. EXAM: MRI HEAD WITHOUT CONTRAST MRA HEAD WITHOUT CONTRAST MRA NECK WITHOUT CONTRAST TECHNIQUE: Multiplanar, multiecho pulse sequences of the brain and surrounding structures were obtained without intravenous contrast. Angiographic images of the Circle of Willis were obtained using MRA technique without intravenous contrast. Angiographic images of the neck were obtained using MRA technique without intravenous contrast. Carotid stenosis measurements (when applicable) are obtained utilizing NASCET criteria, using the distal internal carotid diameter as the denominator. COMPARISON:  Comparison made with prior CT from earlier the same day. FINDINGS: MRI HEAD FINDINGS Brain: Generalized age-related cerebral atrophy. Patchy T2/FLAIR hyperintensity involving the periventricular deep white matter both cerebral hemispheres, consistent chronic small vessel ischemic disease, moderately advanced in nature. Small remote left cerebellar infarct noted. Chronic lacunar infarct at the right thalamus. 1 cm acute ischemic nonhemorrhagic infarcts seen involving the medial left thalamus. No significant mass effect. No other acute or subacute ischemia. No acute intracranial hemorrhage. Few punctate chronic micro hemorrhages noted, likely small vessel related. No mass lesion,  midline shift or  mass effect. No hydrocephalus or extra-axial fluid collection. Pituitary gland within normal limits. Vascular: Major intracranial vascular flow voids are maintained. Skull and upper cervical spine: Craniocervical junction within normal limits. Bone marrow signal intensity within normal limits. No scalp soft tissue abnormality. Sinuses/Orbits: Prior bilateral ocular lens replacement. Paranasal sinuses are largely clear. Small right mastoid effusion noted, of doubtful significance. Other: None. MRA HEAD FINDINGS ANTERIOR CIRCULATION: Examination mildly degraded by motion artifact. Both internal carotid arteries are patent through the siphons without stenosis or other abnormality. A1 segments patent bilaterally. Normal anterior communicating artery complex. Anterior cerebral arteries patent without stenosis. No M1 stenosis or occlusion. MCA branches perfused and fairly symmetric. POSTERIOR CIRCULATION: Vertebral arteries are largely code dominant and patent without stenosis. Right PICA patent. Left PICA origin not seen. Basilar patent without stenosis. Superior cerebellar and posterior cerebral arteries patent bilaterally. No intracranial aneurysm. MRA NECK FINDINGS AORTIC ARCH: Examination technically limited by motion and lack of IV contrast. Aortic arch and origin the great vessels not well assessed on this exam. RIGHT CAROTID SYSTEM: Visualized right common and internal carotid arteries are patent with antegrade flow. No visible dissection. No hemodynamically significant stenosis about the right carotid artery system. LEFT CAROTID SYSTEM: Visualized left common and internal carotid arteries are patent with antegrade flow. No visible dissection. No hemodynamically significant stenosis about the left carotid artery system. VERTEBRAL ARTERIES: Both vertebral arteries appear to arise from the subclavian arteries. Vertebral arteries are patent with antegrade flow. No visible dissection or stenosis.  Other: None. IMPRESSION: MRI HEAD: 1. 1 cm acute ischemic nonhemorrhagic left thalamic infarct. 2. Underlying age-related cerebral atrophy with moderately advanced chronic microvascular ischemic disease, with small remote left cerebellar and right thalamic infarcts. MRA HEAD: Negative intracranial MRA for large vessel occlusion or other emergent finding. No hemodynamically significant or correctable stenosis. MRA NECK: Negative MRA of the neck. No hemodynamically significant stenosis or other acute vascular abnormality. Electronically Signed   By: Virgia Griffins M.D.   On: 10/13/2023 19:43   MR ANGIO HEAD WO CONTRAST Result Date: 10/13/2023 CLINICAL DATA:  Initial evaluation for acute neuro deficit, stroke suspected. EXAM: MRI HEAD WITHOUT CONTRAST MRA HEAD WITHOUT CONTRAST MRA NECK WITHOUT CONTRAST TECHNIQUE: Multiplanar, multiecho pulse sequences of the brain and surrounding structures were obtained without intravenous contrast. Angiographic images of the Circle of Willis were obtained using MRA technique without intravenous contrast. Angiographic images of the neck were obtained using MRA technique without intravenous contrast. Carotid stenosis measurements (when applicable) are obtained utilizing NASCET criteria, using the distal internal carotid diameter as the denominator. COMPARISON:  Comparison made with prior CT from earlier the same day. FINDINGS: MRI HEAD FINDINGS Brain: Generalized age-related cerebral atrophy. Patchy T2/FLAIR hyperintensity involving the periventricular deep white matter both cerebral hemispheres, consistent chronic small vessel ischemic disease, moderately advanced in nature. Small remote left cerebellar infarct noted. Chronic lacunar infarct at the right thalamus. 1 cm acute ischemic nonhemorrhagic infarcts seen involving the medial left thalamus. No significant mass effect. No other acute or subacute ischemia. No acute intracranial hemorrhage. Few punctate chronic micro  hemorrhages noted, likely small vessel related. No mass lesion, midline shift or mass effect. No hydrocephalus or extra-axial fluid collection. Pituitary gland within normal limits. Vascular: Major intracranial vascular flow voids are maintained. Skull and upper cervical spine: Craniocervical junction within normal limits. Bone marrow signal intensity within normal limits. No scalp soft tissue abnormality. Sinuses/Orbits: Prior bilateral ocular lens replacement. Paranasal sinuses are largely clear. Small right mastoid effusion noted, of doubtful  significance. Other: None. MRA HEAD FINDINGS ANTERIOR CIRCULATION: Examination mildly degraded by motion artifact. Both internal carotid arteries are patent through the siphons without stenosis or other abnormality. A1 segments patent bilaterally. Normal anterior communicating artery complex. Anterior cerebral arteries patent without stenosis. No M1 stenosis or occlusion. MCA branches perfused and fairly symmetric. POSTERIOR CIRCULATION: Vertebral arteries are largely code dominant and patent without stenosis. Right PICA patent. Left PICA origin not seen. Basilar patent without stenosis. Superior cerebellar and posterior cerebral arteries patent bilaterally. No intracranial aneurysm. MRA NECK FINDINGS AORTIC ARCH: Examination technically limited by motion and lack of IV contrast. Aortic arch and origin the great vessels not well assessed on this exam. RIGHT CAROTID SYSTEM: Visualized right common and internal carotid arteries are patent with antegrade flow. No visible dissection. No hemodynamically significant stenosis about the right carotid artery system. LEFT CAROTID SYSTEM: Visualized left common and internal carotid arteries are patent with antegrade flow. No visible dissection. No hemodynamically significant stenosis about the left carotid artery system. VERTEBRAL ARTERIES: Both vertebral arteries appear to arise from the subclavian arteries. Vertebral arteries are  patent with antegrade flow. No visible dissection or stenosis. Other: None. IMPRESSION: MRI HEAD: 1. 1 cm acute ischemic nonhemorrhagic left thalamic infarct. 2. Underlying age-related cerebral atrophy with moderately advanced chronic microvascular ischemic disease, with small remote left cerebellar and right thalamic infarcts. MRA HEAD: Negative intracranial MRA for large vessel occlusion or other emergent finding. No hemodynamically significant or correctable stenosis. MRA NECK: Negative MRA of the neck. No hemodynamically significant stenosis or other acute vascular abnormality. Electronically Signed   By: Virgia Griffins M.D.   On: 10/13/2023 19:43   MR ANGIO NECK WO CONTRAST Result Date: 10/13/2023 CLINICAL DATA:  Initial evaluation for acute neuro deficit, stroke suspected. EXAM: MRI HEAD WITHOUT CONTRAST MRA HEAD WITHOUT CONTRAST MRA NECK WITHOUT CONTRAST TECHNIQUE: Multiplanar, multiecho pulse sequences of the brain and surrounding structures were obtained without intravenous contrast. Angiographic images of the Circle of Willis were obtained using MRA technique without intravenous contrast. Angiographic images of the neck were obtained using MRA technique without intravenous contrast. Carotid stenosis measurements (when applicable) are obtained utilizing NASCET criteria, using the distal internal carotid diameter as the denominator. COMPARISON:  Comparison made with prior CT from earlier the same day. FINDINGS: MRI HEAD FINDINGS Brain: Generalized age-related cerebral atrophy. Patchy T2/FLAIR hyperintensity involving the periventricular deep white matter both cerebral hemispheres, consistent chronic small vessel ischemic disease, moderately advanced in nature. Small remote left cerebellar infarct noted. Chronic lacunar infarct at the right thalamus. 1 cm acute ischemic nonhemorrhagic infarcts seen involving the medial left thalamus. No significant mass effect. No other acute or subacute ischemia. No  acute intracranial hemorrhage. Few punctate chronic micro hemorrhages noted, likely small vessel related. No mass lesion, midline shift or mass effect. No hydrocephalus or extra-axial fluid collection. Pituitary gland within normal limits. Vascular: Major intracranial vascular flow voids are maintained. Skull and upper cervical spine: Craniocervical junction within normal limits. Bone marrow signal intensity within normal limits. No scalp soft tissue abnormality. Sinuses/Orbits: Prior bilateral ocular lens replacement. Paranasal sinuses are largely clear. Small right mastoid effusion noted, of doubtful significance. Other: None. MRA HEAD FINDINGS ANTERIOR CIRCULATION: Examination mildly degraded by motion artifact. Both internal carotid arteries are patent through the siphons without stenosis or other abnormality. A1 segments patent bilaterally. Normal anterior communicating artery complex. Anterior cerebral arteries patent without stenosis. No M1 stenosis or occlusion. MCA branches perfused and fairly symmetric. POSTERIOR CIRCULATION: Vertebral arteries are largely code  dominant and patent without stenosis. Right PICA patent. Left PICA origin not seen. Basilar patent without stenosis. Superior cerebellar and posterior cerebral arteries patent bilaterally. No intracranial aneurysm. MRA NECK FINDINGS AORTIC ARCH: Examination technically limited by motion and lack of IV contrast. Aortic arch and origin the great vessels not well assessed on this exam. RIGHT CAROTID SYSTEM: Visualized right common and internal carotid arteries are patent with antegrade flow. No visible dissection. No hemodynamically significant stenosis about the right carotid artery system. LEFT CAROTID SYSTEM: Visualized left common and internal carotid arteries are patent with antegrade flow. No visible dissection. No hemodynamically significant stenosis about the left carotid artery system. VERTEBRAL ARTERIES: Both vertebral arteries appear to  arise from the subclavian arteries. Vertebral arteries are patent with antegrade flow. No visible dissection or stenosis. Other: None. IMPRESSION: MRI HEAD: 1. 1 cm acute ischemic nonhemorrhagic left thalamic infarct. 2. Underlying age-related cerebral atrophy with moderately advanced chronic microvascular ischemic disease, with small remote left cerebellar and right thalamic infarcts. MRA HEAD: Negative intracranial MRA for large vessel occlusion or other emergent finding. No hemodynamically significant or correctable stenosis. MRA NECK: Negative MRA of the neck. No hemodynamically significant stenosis or other acute vascular abnormality. Electronically Signed   By: Virgia Griffins M.D.   On: 10/13/2023 19:43   CT HEAD CODE STROKE WO CONTRAST Result Date: 10/13/2023 CLINICAL DATA:  Code stroke. Neuro deficit, acute, stroke suspected. Right-sided weakness, slurred speech, and altered mental status. EXAM: CT HEAD WITHOUT CONTRAST TECHNIQUE: Contiguous axial images were obtained from the base of the skull through the vertex without intravenous contrast. RADIATION DOSE REDUCTION: This exam was performed according to the departmental dose-optimization program which includes automated exposure control, adjustment of the mA and/or kV according to patient size and/or use of iterative reconstruction technique. COMPARISON:  Head MRI 12/13/2022 FINDINGS: Brain: There is no evidence of an acute infarct, intracranial hemorrhage, mass, midline shift, or extra-axial fluid collection. Mild cerebral atrophy is within limits for age. Patchy cerebral white matter hypodensities are nonspecific but compatible with mild-to-moderate chronic small vessel ischemic disease. Abrahm Mancia small chronic left cerebellar infarct is new. Vascular: No hyperdense vessel. Skull: No acute fracture or suspicious lesion. Sinuses/Orbits: Mild mucosal thickening/small mucous retention cyst inferiorly in the right maxillary sinus. Chronic left mastoid  effusion. Bilateral cataract extraction. Other: None. ASPECTS ([place] Stroke Program Early CT Score) - Ganglionic level infarction (caudate, lentiform nuclei, internal capsule, insula, M1-M3 cortex): 7 - Supraganglionic infarction (M4-M6 cortex): 3 Total score (0-10 with 10 being normal): 10 These results were communicated to Dr. Lindzen at 6:01 pm on 10/13/2023 by text page via the [facility] messaging system. IMPRESSION: 1. No evidence of acute intracranial abnormality. ASPECTS of 10. 2. Mild-to-moderate chronic small vessel ischemic disease. 3. Interval chronic left cerebellar infarct. Electronically Signed   By: Aundra Lee M.D.   On: 10/13/2023 18:01        Scheduled Meds:  aspirin   81 mg Oral Daily   clopidogrel  75 mg Oral Daily   ezetimibe  10 mg Oral Daily   [START ON 10/15/2023] ferrous sulfate  325 mg Oral Once per day on Monday Wednesday Friday   heparin  5,000 Units Subcutaneous Q8H   pantoprazole  40 mg Oral Daily   [START ON 10/15/2023] pneumococcal 20-valent conjugate vaccine  0.5 mL Intramuscular Tomorrow-1000   rosuvastatin   40 mg Oral Daily   Continuous Infusions:  sodium chloride 40 mL/hr at 10/14/23 1600     LOS: 1 day    Time  spent: over 30 min     Donnetta Gains, MD [facility]   To contact the attending provider between 7A-7P or the covering provider during after hours 7P-7A, please log into the web site [email or web address] and access using universal [place] password for that web site. If you do not have the password, please call the hospital operator.  10/14/2023, 7:13 PM

## 2023-10-14 NOTE — Progress Notes (Addendum)
 STROKE TEAM PROGRESS NOTE   INTERIM HISTORY/SUBJECTIVE Patient sitting up in bed eating breakfast in NAD.  MRI brain with left thalamic infarct  Patient does not remember reason for presenting to the hospital except she states she passed out.    CBC    Component Value Date/Time   WBC 6.3 10/14/2023 0100   RBC 4.19 10/14/2023 0100   HGB 11.4 (L) 10/14/2023 0100   HCT 37.0 10/14/2023 0100   PLT 205 10/14/2023 0100   MCV 88.3 10/14/2023 0100   MCH 27.2 10/14/2023 0100   MCHC 30.8 10/14/2023 0100   RDW 15.5 10/14/2023 0100   LYMPHSABS 1.5 10/13/2023 2221   MONOABS 0.5 10/13/2023 2221   EOSABS 0.3 10/13/2023 2221   BASOSABS 0.1 10/13/2023 2221    BMET    Component Value Date/Time   NA 142 10/13/2023 2221   K 4.3 10/13/2023 2221   CL 106 10/13/2023 2221   CO2 26 10/13/2023 2221   GLUCOSE 94 10/13/2023 2221   BUN 13 10/13/2023 2221   CREATININE 0.81 10/14/2023 0100   CALCIUM  9.0 10/13/2023 2221   GFRNONAA >60 10/14/2023 0100    IMAGING past 24 hours DG CHEST PORT 1 VIEW Result Date: 10/13/2023 CLINICAL DATA:  Cerebrovascular accident EXAM: PORTABLE CHEST 1 VIEW COMPARISON:  04/11/2018 FINDINGS: Low lung volumes accentuate cardiac silhouette and pulmonary vascularity. Question basilar infiltrates. No pleural effusion or pneumothorax. No displaced rib fractures. IMPRESSION: Low lung volumes with basilar infiltrates versus atelectasis. Electronically Signed   By: Rozell Cornet M.D.   On: 10/13/2023 23:25   MR BRAIN WO CONTRAST Result Date: 10/13/2023 CLINICAL DATA:  Initial evaluation for acute neuro deficit, stroke suspected. EXAM: MRI HEAD WITHOUT CONTRAST MRA HEAD WITHOUT CONTRAST MRA NECK WITHOUT CONTRAST TECHNIQUE: Multiplanar, multiecho pulse sequences of the brain and surrounding structures were obtained without intravenous contrast. Angiographic images of the Circle of Willis were obtained using MRA technique without intravenous contrast. Angiographic images of the neck  were obtained using MRA technique without intravenous contrast. Carotid stenosis measurements (when applicable) are obtained utilizing NASCET criteria, using the distal internal carotid diameter as the denominator. COMPARISON:  Comparison made with prior CT from earlier the same day. FINDINGS: MRI HEAD FINDINGS Brain: Generalized age-related cerebral atrophy. Patchy T2/FLAIR hyperintensity involving the periventricular deep white matter both cerebral hemispheres, consistent chronic small vessel ischemic disease, moderately advanced in nature. Small remote left cerebellar infarct noted. Chronic lacunar infarct at the right thalamus. 1 cm acute ischemic nonhemorrhagic infarcts seen involving the medial left thalamus. No significant mass effect. No other acute or subacute ischemia. No acute intracranial hemorrhage. Few punctate chronic micro hemorrhages noted, likely small vessel related. No mass lesion, midline shift or mass effect. No hydrocephalus or extra-axial fluid collection. Pituitary gland within normal limits. Vascular: Major intracranial vascular flow voids are maintained. Skull and upper cervical spine: Craniocervical junction within normal limits. Bone marrow signal intensity within normal limits. No scalp soft tissue abnormality. Sinuses/Orbits: Prior bilateral ocular lens replacement. Paranasal sinuses are largely clear. Small right mastoid effusion noted, of doubtful significance. Other: None. MRA HEAD FINDINGS ANTERIOR CIRCULATION: Examination mildly degraded by motion artifact. Both internal carotid arteries are patent through the siphons without stenosis or other abnormality. A1 segments patent bilaterally. Normal anterior communicating artery complex. Anterior cerebral arteries patent without stenosis. No M1 stenosis or occlusion. MCA branches perfused and fairly symmetric. POSTERIOR CIRCULATION: Vertebral arteries are largely code dominant and patent without stenosis. Right PICA patent. Left PICA  origin not seen. Basilar patent  without stenosis. Superior cerebellar and posterior cerebral arteries patent bilaterally. No intracranial aneurysm. MRA NECK FINDINGS AORTIC ARCH: Examination technically limited by motion and lack of IV contrast. Aortic arch and origin the great vessels not well assessed on this exam. RIGHT CAROTID SYSTEM: Visualized right common and internal carotid arteries are patent with antegrade flow. No visible dissection. No hemodynamically significant stenosis about the right carotid artery system. LEFT CAROTID SYSTEM: Visualized left common and internal carotid arteries are patent with antegrade flow. No visible dissection. No hemodynamically significant stenosis about the left carotid artery system. VERTEBRAL ARTERIES: Both vertebral arteries appear to arise from the subclavian arteries. Vertebral arteries are patent with antegrade flow. No visible dissection or stenosis. Other: None. IMPRESSION: MRI HEAD: 1. 1 cm acute ischemic nonhemorrhagic left thalamic infarct. 2. Underlying age-related cerebral atrophy with moderately advanced chronic microvascular ischemic disease, with small remote left cerebellar and right thalamic infarcts. MRA HEAD: Negative intracranial MRA for large vessel occlusion or other emergent finding. No hemodynamically significant or correctable stenosis. MRA NECK: Negative MRA of the neck. No hemodynamically significant stenosis or other acute vascular abnormality. Electronically Signed   By: Virgia Griffins M.D.   On: 10/13/2023 19:43   MR ANGIO HEAD WO CONTRAST Result Date: 10/13/2023 CLINICAL DATA:  Initial evaluation for acute neuro deficit, stroke suspected. EXAM: MRI HEAD WITHOUT CONTRAST MRA HEAD WITHOUT CONTRAST MRA NECK WITHOUT CONTRAST TECHNIQUE: Multiplanar, multiecho pulse sequences of the brain and surrounding structures were obtained without intravenous contrast. Angiographic images of the Circle of Willis were obtained using MRA technique  without intravenous contrast. Angiographic images of the neck were obtained using MRA technique without intravenous contrast. Carotid stenosis measurements (when applicable) are obtained utilizing NASCET criteria, using the distal internal carotid diameter as the denominator. COMPARISON:  Comparison made with prior CT from earlier the same day. FINDINGS: MRI HEAD FINDINGS Brain: Generalized age-related cerebral atrophy. Patchy T2/FLAIR hyperintensity involving the periventricular deep white matter both cerebral hemispheres, consistent chronic small vessel ischemic disease, moderately advanced in nature. Small remote left cerebellar infarct noted. Chronic lacunar infarct at the right thalamus. 1 cm acute ischemic nonhemorrhagic infarcts seen involving the medial left thalamus. No significant mass effect. No other acute or subacute ischemia. No acute intracranial hemorrhage. Few punctate chronic micro hemorrhages noted, likely small vessel related. No mass lesion, midline shift or mass effect. No hydrocephalus or extra-axial fluid collection. Pituitary gland within normal limits. Vascular: Major intracranial vascular flow voids are maintained. Skull and upper cervical spine: Craniocervical junction within normal limits. Bone marrow signal intensity within normal limits. No scalp soft tissue abnormality. Sinuses/Orbits: Prior bilateral ocular lens replacement. Paranasal sinuses are largely clear. Small right mastoid effusion noted, of doubtful significance. Other: None. MRA HEAD FINDINGS ANTERIOR CIRCULATION: Examination mildly degraded by motion artifact. Both internal carotid arteries are patent through the siphons without stenosis or other abnormality. A1 segments patent bilaterally. Normal anterior communicating artery complex. Anterior cerebral arteries patent without stenosis. No M1 stenosis or occlusion. MCA branches perfused and fairly symmetric. POSTERIOR CIRCULATION: Vertebral arteries are largely code  dominant and patent without stenosis. Right PICA patent. Left PICA origin not seen. Basilar patent without stenosis. Superior cerebellar and posterior cerebral arteries patent bilaterally. No intracranial aneurysm. MRA NECK FINDINGS AORTIC ARCH: Examination technically limited by motion and lack of IV contrast. Aortic arch and origin the great vessels not well assessed on this exam. RIGHT CAROTID SYSTEM: Visualized right common and internal carotid arteries are patent with antegrade flow. No visible dissection. No hemodynamically  significant stenosis about the right carotid artery system. LEFT CAROTID SYSTEM: Visualized left common and internal carotid arteries are patent with antegrade flow. No visible dissection. No hemodynamically significant stenosis about the left carotid artery system. VERTEBRAL ARTERIES: Both vertebral arteries appear to arise from the subclavian arteries. Vertebral arteries are patent with antegrade flow. No visible dissection or stenosis. Other: None. IMPRESSION: MRI HEAD: 1. 1 cm acute ischemic nonhemorrhagic left thalamic infarct. 2. Underlying age-related cerebral atrophy with moderately advanced chronic microvascular ischemic disease, with small remote left cerebellar and right thalamic infarcts. MRA HEAD: Negative intracranial MRA for large vessel occlusion or other emergent finding. No hemodynamically significant or correctable stenosis. MRA NECK: Negative MRA of the neck. No hemodynamically significant stenosis or other acute vascular abnormality. Electronically Signed   By: Virgia Griffins M.D.   On: 10/13/2023 19:43   MR ANGIO NECK WO CONTRAST Result Date: 10/13/2023 CLINICAL DATA:  Initial evaluation for acute neuro deficit, stroke suspected. EXAM: MRI HEAD WITHOUT CONTRAST MRA HEAD WITHOUT CONTRAST MRA NECK WITHOUT CONTRAST TECHNIQUE: Multiplanar, multiecho pulse sequences of the brain and surrounding structures were obtained without intravenous contrast. Angiographic  images of the Circle of Willis were obtained using MRA technique without intravenous contrast. Angiographic images of the neck were obtained using MRA technique without intravenous contrast. Carotid stenosis measurements (when applicable) are obtained utilizing NASCET criteria, using the distal internal carotid diameter as the denominator. COMPARISON:  Comparison made with prior CT from earlier the same day. FINDINGS: MRI HEAD FINDINGS Brain: Generalized age-related cerebral atrophy. Patchy T2/FLAIR hyperintensity involving the periventricular deep white matter both cerebral hemispheres, consistent chronic small vessel ischemic disease, moderately advanced in nature. Small remote left cerebellar infarct noted. Chronic lacunar infarct at the right thalamus. 1 cm acute ischemic nonhemorrhagic infarcts seen involving the medial left thalamus. No significant mass effect. No other acute or subacute ischemia. No acute intracranial hemorrhage. Few punctate chronic micro hemorrhages noted, likely small vessel related. No mass lesion, midline shift or mass effect. No hydrocephalus or extra-axial fluid collection. Pituitary gland within normal limits. Vascular: Major intracranial vascular flow voids are maintained. Skull and upper cervical spine: Craniocervical junction within normal limits. Bone marrow signal intensity within normal limits. No scalp soft tissue abnormality. Sinuses/Orbits: Prior bilateral ocular lens replacement. Paranasal sinuses are largely clear. Small right mastoid effusion noted, of doubtful significance. Other: None. MRA HEAD FINDINGS ANTERIOR CIRCULATION: Examination mildly degraded by motion artifact. Both internal carotid arteries are patent through the siphons without stenosis or other abnormality. A1 segments patent bilaterally. Normal anterior communicating artery complex. Anterior cerebral arteries patent without stenosis. No M1 stenosis or occlusion. MCA branches perfused and fairly symmetric.  POSTERIOR CIRCULATION: Vertebral arteries are largely code dominant and patent without stenosis. Right PICA patent. Left PICA origin not seen. Basilar patent without stenosis. Superior cerebellar and posterior cerebral arteries patent bilaterally. No intracranial aneurysm. MRA NECK FINDINGS AORTIC ARCH: Examination technically limited by motion and lack of IV contrast. Aortic arch and origin the great vessels not well assessed on this exam. RIGHT CAROTID SYSTEM: Visualized right common and internal carotid arteries are patent with antegrade flow. No visible dissection. No hemodynamically significant stenosis about the right carotid artery system. LEFT CAROTID SYSTEM: Visualized left common and internal carotid arteries are patent with antegrade flow. No visible dissection. No hemodynamically significant stenosis about the left carotid artery system. VERTEBRAL ARTERIES: Both vertebral arteries appear to arise from the subclavian arteries. Vertebral arteries are patent with antegrade flow. No visible dissection or stenosis. Other:  None. IMPRESSION: MRI HEAD: 1. 1 cm acute ischemic nonhemorrhagic left thalamic infarct. 2. Underlying age-related cerebral atrophy with moderately advanced chronic microvascular ischemic disease, with small remote left cerebellar and right thalamic infarcts. MRA HEAD: Negative intracranial MRA for large vessel occlusion or other emergent finding. No hemodynamically significant or correctable stenosis. MRA NECK: Negative MRA of the neck. No hemodynamically significant stenosis or other acute vascular abnormality. Electronically Signed   By: Virgia Griffins M.D.   On: 10/13/2023 19:43   CT HEAD CODE STROKE WO CONTRAST Result Date: 10/13/2023 CLINICAL DATA:  Code stroke. Neuro deficit, acute, stroke suspected. Right-sided weakness, slurred speech, and altered mental status. EXAM: CT HEAD WITHOUT CONTRAST TECHNIQUE: Contiguous axial images were obtained from the base of the skull  through the vertex without intravenous contrast. RADIATION DOSE REDUCTION: This exam was performed according to the departmental dose-optimization program which includes automated exposure control, adjustment of the mA and/or kV according to patient size and/or use of iterative reconstruction technique. COMPARISON:  Head MRI 12/13/2022 FINDINGS: Brain: There is no evidence of an acute infarct, intracranial hemorrhage, mass, midline shift, or extra-axial fluid collection. Mild cerebral atrophy is within limits for age. Patchy cerebral white matter hypodensities are nonspecific but compatible with mild-to-moderate chronic small vessel ischemic disease. A small chronic left cerebellar infarct is new. Vascular: No hyperdense vessel. Skull: No acute fracture or suspicious lesion. Sinuses/Orbits: Mild mucosal thickening/small mucous retention cyst inferiorly in the right maxillary sinus. Chronic left mastoid effusion. Bilateral cataract extraction. Other: None. ASPECTS ([place] Stroke Program Early CT Score) - Ganglionic level infarction (caudate, lentiform nuclei, internal capsule, insula, M1-M3 cortex): 7 - Supraganglionic infarction (M4-M6 cortex): 3 Total score (0-10 with 10 being normal): 10 These results were communicated to Dr. Lindzen at 6:01 pm on 10/13/2023 by text page via the [facility] messaging system. IMPRESSION: 1. No evidence of acute intracranial abnormality. ASPECTS of 10. 2. Mild-to-moderate chronic small vessel ischemic disease. 3. Interval chronic left cerebellar infarct. Electronically Signed   By: Aundra Lee M.D.   On: 10/13/2023 18:01    Vitals:   10/13/23 2238 10/13/23 2303 10/14/23 0415 10/14/23 0803  BP:  (!) 161/87 (!) 168/84 (!) 161/88  Pulse:  62 (!) 54 (!) 55  Resp:  18 18 16   Temp: 98.5 F (36.9 C) 98.2 F (36.8 C) (!) 97.4 F (36.3 C) 98.1 F (36.7 C)  TempSrc: Oral Oral Oral Oral  SpO2:  98% 98% 99%  Weight:      Height:         PHYSICAL EXAM General:  Alert,  well-nourished, well-developed patient in no acute distress Psych:  Mood and affect appropriate for situation CV: Regular rate and rhythm on monitor Respiratory:  Regular, unlabored respirations on room air GI: Abdomen soft and nontender   NEURO:  Mental Status: AA&Ox3, patient is able to give clear and coherent history Speech/Language: speech is without dysarthria or aphasia.  Naming, repetition, fluency, and comprehension intact.  Cranial Nerves:  II: PERRL. Visual fields full.  III, IV, VI: EOMI. Eyelids elevate symmetrically.  V: Sensation is intact to light touch and symmetrical to face.  VII: Face is symmetrical resting and smiling VIII: hearing intact to voice. IX, X: Palate elevates symmetrically. Phonation is normal.  WJ:[id number] shrug 5/5. XII: tongue is midline without fasciculations. Motor: 5/5 in right arm, left arm 3/5 (old), right lower 4-/5, left lower 3/5 Tone: is normal and bulk is normal Sensation- decreased on left (old) Coordination: FTN intact bilaterally, HKS: no ataxia  in BLE.No drift.  Gait- deferred  Most Recent NIH   1a Level of Conscious.:  1b LOC Questions:  1c LOC Commands:  2 Best Gaze:  3 Visual:  4 Facial Palsy:  5a Motor Arm - left: 1 5b Motor Arm - Right: 0 6a Motor Leg - Left: 3 6b Motor Leg - Right: 2 7 Limb Ataxia:  8 Sensory: 1 9 Best Language:  10 Dysarthria:  11 Extinct. and Inatten.:  TOTAL: 7   ASSESSMENT/PLAN  [name] is a 89 y.o. female with history of  DVT, HTN, HLD, GERD, gout, stroke and obesity who presents with acute onset altered mental status and right sided weakness. She was nearly nonverbal on arrival but did improve over the next 30 minutes, becoming oriented to place, time and situation  NIH on Admission 13  Acute Ischemic Infarct:  left thalamic  Etiology:  small vessel disease   Code Stroke CT head No acute abnormality. Small vessel disease. ASPECTS 10.   chronic left cerebellar infarct.  MRI   left thalamic infarct.  atrophy with moderately advanced chronic microvascular ischemic disease, with small remote left cerebellar and right thalamic infarcts. MRA   NO LVO  Carotid Doppler  ordered 2D Echo ordered LDL 87 HgbA1c 6.0 VTE prophylaxis - heparin SQ aspirin  81 mg daily prior to admission, now on aspirin  81 mg daily and clopidogrel 75 mg daily for 3 weeks and then plavix alone. 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  Hypertension Home meds:  amlodipine 10mg , IMDUR 60mg , losartan 100mg , atenolol 100mg   Stable Blood Pressure Goal: BP less than 220/110   Hyperlipidemia Home meds:  zetia 10mg ,  resumed in hospital LDL 87, goal < 70 Add crestor  40mg   Continue statin at discharge  Dysphagia Patient has post-stroke dysphagia, SLP consulted    Diet   Diet regular Fluid consistency: Thin   Advance diet as tolerated  Other Stroke Risk Factors Obesity, Body mass index is 45.41 kg/m., BMI >/= 30 associated with increased stroke risk, recommend weight loss, diet and exercise as appropriate    Other Active Problems GERD Neuropathy  Gout  Hospital day # 1   Jonette Nestle DNP, ACNPC-AG  [facility]    I have personally obtained history,examined this patient, reviewed notes, independently viewed imaging studies, participated in medical decision making and plan of care.ROS completed by me personally and pertinent positives fully documented  I have made any additions or clarifications directly to the above note. Agree with note above. Spent a total of 35 minutes dedicated to the care of this patient.   Cassandra Cleveland, MD Neurology  10/14/2023 2:50 PM   To contact Stroke Continuity provider, please refer to [email or web address]. After hours, contact General Neurology

## 2023-10-14 NOTE — Progress Notes (Signed)
 VASCULAR LAB    Carotid duplex has been performed.  See CV proc for preliminary results.   Kristol Almanzar, RVT 10/14/2023, 11:46 AM

## 2023-10-14 NOTE — Progress Notes (Signed)
 Echocardiogram 2D Echocardiogram has been performed.  Laurianne Floresca N Taraya Steward,RDCS 10/14/2023, 11:21 AM

## 2023-10-14 NOTE — Evaluation (Signed)
 Physical Therapy Evaluation Patient Details Name: Destiny Ruiz MRN: [id number] DOB: [birth year] Today's Date: 10/14/2023  History of Present Illness  89 yo female presents to [facility] 6/14 with acute onset R weakness and AMS. MRI shows 1 cm acute ischemic nonhemorrhagic left thalamic infarct. PMHx of HTN, HLD, neuropathy, stroke and obesity.  Clinical Impression  Pt presents with LE weakness (unable to test against resistance due to pt pain and fatigue), impaired balance, significant LUE and LLE pain which pt describes as whole arm/leg and tingling, impaired activity tolerance. Pt to benefit from acute PT to address deficits. Pt ambulated short distance at bedside, overall requiring up to mod physical assist for this. Pt limited by pain and dizziness, SBP 177 upon return to supine. Pt is from a senior apt community and is typically independent with mobility with walker, would recommend short-term in-patient, low-intensity rehab post-acutely. Pt is resistant to this, but granddaughter is supportive of SNF at bedside. PT to progress mobility as tolerated, and will continue to follow acutely.          If plan is discharge home, recommend the following: A lot of help with walking and/or transfers;A lot of help with bathing/dressing/bathroom;Assistance with cooking/housework;Help with stairs or ramp for entrance;Assist for transportation   Can travel by private vehicle   No    Equipment Recommendations None recommended by PT  Recommendations for Other Services       Functional Status Assessment Patient has had a recent decline in their functional status and demonstrates the ability to make significant improvements in function in a reasonable and predictable amount of time.     Precautions / Restrictions Precautions Precautions: Fall Restrictions Weight Bearing Restrictions Per Provider Order: No      Mobility  Bed Mobility Overal bed mobility: Needs Assistance Bed Mobility: Supine to  Sit, Sit to Supine, Rolling Rolling: Mod assist   Supine to sit: Mod assist Sit to supine: Mod assist, +2 for physical assistance   General bed mobility comments: assist for roll bilat for hip translation. assist for supine<>sit for LE and trunk management, scooting to EOB with bed pad, and +2 boost assist.    Transfers Overall transfer level: Needs assistance Equipment used: Rolling walker (2 wheels) Transfers: Sit to/from Stand Sit to Stand: Mod assist, From elevated surface           General transfer comment: assist for power up, rise, steady    Ambulation/Gait Ambulation/Gait assistance: Min assist Gait Distance (Feet): 3 Feet Assistive device: Rolling walker (2 wheels) Gait Pattern/deviations: Step-to pattern, Decreased dorsiflexion - left, Decreased dorsiflexion - right, Decreased step length - left, Decreased step length - right, Trunk flexed Gait velocity: decr     General Gait Details: assist to steady and guide RW, cues for forward stepping and placement in RW. limited in tolerance due to LE pain L>R, also endorsing dizziness.  Stairs            Wheelchair Mobility     Tilt Bed    Modified Rankin (Stroke Patients Only)       Balance Overall balance assessment: Needs assistance Sitting-balance support: No upper extremity supported, Feet supported Sitting balance-Leahy Scale: Fair     Standing balance support: Bilateral upper extremity supported, During functional activity, Reliant on assistive device for balance Standing balance-Leahy Scale: Poor                               Pertinent  Vitals/Pain Pain Assessment Pain Assessment: Faces Faces Pain Scale: Hurts even more Pain Location: L leg and L arm Pain Descriptors / Indicators: Tingling, Sore Pain Intervention(s): Limited activity within patient's tolerance, Monitored during session, Repositioned    Home Living Family/patient expects to be discharged to:: Private residence  (per address it is more a senior living community, have communal spaces but not technically an ALF like pt states) Living Arrangements: Alone Available Help at Discharge: Family;Friend(s);Available PRN/intermittently Type of Home: Independent living facility Home Access: Level entry;Elevator       Home Layout: One level Home Equipment: Rollator (4 wheels);Rolling Walker (2 wheels);Toilet riser;Shower seat - built in;Shower seat;Lift chair;Wheelchair - power;Wheelchair - manual      Prior Function Prior Level of Function : Independent/Modified Independent             Mobility Comments: walks with rollator in ALF, uses power wheelchair when outside and regular w/c if she goes out in car with family. Does not drive ADLs Comments: independent with dressing and bathing, has meals in her apt. children get pt her groceries, and has assist from family for cleaning apt     Extremity/Trunk Assessment   Upper Extremity Assessment Upper Extremity Assessment: Defer to OT evaluation;Right hand dominant (pt states L arm has been weaker, NOT R since CVA.Aaron Aas)    Lower Extremity Assessment Lower Extremity Assessment: Generalized weakness (grossly 3/5 bilat, symmetric-appearing but unable to test against resistance given LE pain)    Cervical / Trunk Assessment Cervical / Trunk Assessment: Kyphotic  Communication   Communication Communication: No apparent difficulties    Cognition Arousal: Alert Behavior During Therapy: WFL for tasks assessed/performed   PT - Cognitive impairments: No apparent impairments                       PT - Cognition Comments: lacks insight into present deficits Following commands: Intact       Cueing Cueing Techniques: Verbal cues, Gestural cues     General Comments General comments (skin integrity, edema, etc.): SBP 177 upon return to supine    Exercises     Assessment/Plan    PT Assessment Patient needs continued PT services  PT Problem  List Decreased strength;Decreased mobility;Decreased activity tolerance;Decreased knowledge of use of DME;Decreased balance;Decreased safety awareness;Pain;Obesity       PT Treatment Interventions DME instruction;Therapeutic activities;Gait training;Therapeutic exercise;Patient/family education;Balance training;Functional mobility training;Neuromuscular re-education    PT Goals (Current goals can be found in the Care Plan section)  Acute Rehab PT Goals Patient Stated Goal: go home PT Goal Formulation: With patient/family Time For Goal Achievement: 10/28/23 Potential to Achieve Goals: Good    Frequency Min 3X/week     Co-evaluation               AM-PAC PT 6 Clicks Mobility  Outcome Measure Help needed turning from your back to your side while in a flat bed without using bedrails?: A Lot Help needed moving from lying on your back to sitting on the side of a flat bed without using bedrails?: A Lot Help needed moving to and from a bed to a chair (including a wheelchair)?: A Lot Help needed standing up from a chair using your arms (e.g., wheelchair or bedside chair)?: A Lot Help needed to walk in hospital room?: Total Help needed climbing 3-5 steps with a railing? : Total 6 Click Score: 10    End of Session   Activity Tolerance: Patient limited by fatigue;Patient limited by pain  Patient left: in bed;with call bell/phone within reach;with bed alarm set;with family/visitor present (pt's granddaughter) Nurse Communication: Mobility status PT Visit Diagnosis: Other abnormalities of gait and mobility (R26.89);Muscle weakness (generalized) (M62.81);Pain Pain - Right/Left: Left Pain - part of body: Leg;Arm    Time: 1610-9604 PT Time Calculation (min) (ACUTE ONLY): 30 min   Charges:   PT Evaluation $PT Eval Low Complexity: 1 Low PT Treatments $Therapeutic Activity: 8-22 mins PT General Charges $$ ACUTE PT VISIT: 1 Visit         Shirlene Doughty, PT DPT Acute Rehabilitation  Services Secure Chat Preferred  Office [phone]   Samaia Iwata E Burnadette Carrion 10/14/2023, 12:45 PM

## 2023-10-14 NOTE — Plan of Care (Signed)
   Problem: Education: Goal: Knowledge of disease or condition will improve Outcome: Progressing Goal: Knowledge of secondary prevention will improve (MUST DOCUMENT ALL) Outcome: Progressing Goal: Knowledge of patient specific risk factors will improve (DELETE if not current risk factor) Outcome: Progressing

## 2023-10-15 DIAGNOSIS — R29705 NIHSS score 5: Secondary | ICD-10-CM

## 2023-10-15 DIAGNOSIS — I10 Essential (primary) hypertension: Secondary | ICD-10-CM

## 2023-10-15 DIAGNOSIS — I739 Peripheral vascular disease, unspecified: Secondary | ICD-10-CM

## 2023-10-15 DIAGNOSIS — E785 Hyperlipidemia, unspecified: Secondary | ICD-10-CM

## 2023-10-15 DIAGNOSIS — I6389 Other cerebral infarction: Secondary | ICD-10-CM | POA: Diagnosis not present

## 2023-10-15 DIAGNOSIS — I69391 Dysphagia following cerebral infarction: Secondary | ICD-10-CM

## 2023-10-15 DIAGNOSIS — I639 Cerebral infarction, unspecified: Secondary | ICD-10-CM | POA: Diagnosis not present

## 2023-10-15 LAB — CBC WITH DIFFERENTIAL/PLATELET
Abs Immature Granulocytes: 0.04 10*3/uL (ref 0.00–0.07)
Basophils Absolute: 0.1 10*3/uL (ref 0.0–0.1)
Basophils Relative: 1 %
Eosinophils Absolute: 0.3 10*3/uL (ref 0.0–0.5)
Eosinophils Relative: 5 %
HCT: 36.3 % (ref 36.0–46.0)
Hemoglobin: 11.1 g/dL — ABNORMAL LOW (ref 12.0–15.0)
Immature Granulocytes: 1 %
Lymphocytes Relative: 24 %
Lymphs Abs: 1.4 10*3/uL (ref 0.7–4.0)
MCH: 26.4 pg (ref 26.0–34.0)
MCHC: 30.6 g/dL (ref 30.0–36.0)
MCV: 86.4 fL (ref 80.0–100.0)
Monocytes Absolute: 0.6 10*3/uL (ref 0.1–1.0)
Monocytes Relative: 10 %
Neutro Abs: 3.6 10*3/uL (ref 1.7–7.7)
Neutrophils Relative %: 59 %
Platelets: 173 10*3/uL (ref 150–400)
RBC: 4.2 MIL/uL (ref 3.87–5.11)
RDW: 15.1 % (ref 11.5–15.5)
WBC: 6.1 10*3/uL (ref 4.0–10.5)
nRBC: 0 % (ref 0.0–0.2)

## 2023-10-15 LAB — COMPREHENSIVE METABOLIC PANEL WITH GFR
ALT: 9 U/L (ref 0–44)
AST: 17 U/L (ref 15–41)
Albumin: 2.8 g/dL — ABNORMAL LOW (ref 3.5–5.0)
Alkaline Phosphatase: 32 U/L — ABNORMAL LOW (ref 38–126)
Anion gap: 7 (ref 5–15)
BUN: 16 mg/dL (ref 8–23)
CO2: 29 mmol/L (ref 22–32)
Calcium: 8.8 mg/dL — ABNORMAL LOW (ref 8.9–10.3)
Chloride: 105 mmol/L (ref 98–111)
Creatinine, Ser: 0.94 mg/dL (ref 0.44–1.00)
GFR, Estimated: 58 mL/min — ABNORMAL LOW (ref >60)
Glucose, Bld: 95 mg/dL (ref 70–99)
Potassium: 4.3 mmol/L (ref 3.5–5.1)
Sodium: 141 mmol/L (ref 135–145)
Total Bilirubin: 0.8 mg/dL (ref 0.0–1.2)
Total Protein: 6.7 g/dL (ref 6.5–8.1)

## 2023-10-15 LAB — MAGNESIUM: Magnesium: 1.9 mg/dL (ref 1.7–2.4)

## 2023-10-15 LAB — PHOSPHORUS: Phosphorus: 4.3 mg/dL (ref 2.5–4.6)

## 2023-10-15 MED ORDER — AMLODIPINE BESYLATE 5 MG PO TABS
10.0000 mg | ORAL_TABLET | Freq: Every day | ORAL | Status: DC
  Administered 2023-10-15 – 2023-10-16 (×2): 10 mg via ORAL
  Filled 2023-10-15 (×2): qty 2

## 2023-10-15 NOTE — Progress Notes (Addendum)
 STROKE TEAM PROGRESS NOTE   INTERIM HISTORY/SUBJECTIVE Patient sitting up in bedside chair and appears comfortable.  No complaints. MRI brain with left thalamic infarct carotid ultrasound shows no significant extracranial stenosis.  Echocardiogram showed EF of 65 to 70%.  Mild left atrial dilatation. Patient states she has never been evaluated for sleep apnea.  She is is interested in considering participation in sleep smart stroke prevention study.  Her daughter and granddaughter at the bedside   CBC    Component Value Date/Time   WBC 6.1 10/15/2023 0438   RBC 4.20 10/15/2023 0438   HGB 11.1 (L) 10/15/2023 0438   HCT 36.3 10/15/2023 0438   PLT 173 10/15/2023 0438   MCV 86.4 10/15/2023 0438   MCH 26.4 10/15/2023 0438   MCHC 30.6 10/15/2023 0438   RDW 15.1 10/15/2023 0438   LYMPHSABS 1.4 10/15/2023 0438   MONOABS 0.6 10/15/2023 0438   EOSABS 0.3 10/15/2023 0438   BASOSABS 0.1 10/15/2023 0438    BMET    Component Value Date/Time   NA 141 10/15/2023 0438   K 4.3 10/15/2023 0438   CL 105 10/15/2023 0438   CO2 29 10/15/2023 0438   GLUCOSE 95 10/15/2023 0438   BUN 16 10/15/2023 0438   CREATININE 0.94 10/15/2023 0438   CALCIUM  8.8 (L) 10/15/2023 0438   GFRNONAA 58 (L) 10/15/2023 0438    IMAGING past 24 hours No results found.   Vitals:   10/15/23 4782 10/15/23 0727 10/15/23 1038 10/15/23 1220  BP: (!) 169/78 (!) 153/71 (!) 147/97 133/63  Pulse: (!) 59 (!) 54  63  Resp: 18 19  19   Temp: 98.3 F (36.8 C)     TempSrc: Oral     SpO2: 99% 100%  98%  Weight:      Height:         PHYSICAL EXAM General:  Alert, well-nourished, well-developed patient in no acute distress Psych:  Mood and affect appropriate for situation CV: Regular rate and rhythm on monitor Respiratory:  Regular, unlabored respirations on room air GI: Abdomen soft and nontender   NEURO:  Mental Status: AA&Ox3, patient is able to give clear and coherent history Speech/Language: speech is without  dysarthria or aphasia.  Naming, repetition, fluency, and comprehension intact.  Cranial Nerves:  II: PERRL. Visual fields full.  III, IV, VI: EOMI. Eyelids elevate symmetrically.  V: Sensation is intact to light touch and symmetrical to face.  VII: Face is symmetrical resting and smiling VIII: hearing intact to voice. IX, X: Palate elevates symmetrically. Phonation is normal.  NF:AOZHYQMV shrug 5/5. XII: tongue is midline without fasciculations. Motor: 5/5 in right arm, left arm 3/5 (old), right lower 4-/5, left lower 3/5 Tone: is normal and bulk is normal Sensation- decreased on left (old) Coordination: FTN intact bilaterally, HKS: no ataxia in BLE.No drift.  Gait- deferred  Most Recent NIH   1a Level of Conscious.:  1b LOC Questions:  1c LOC Commands:  2 Best Gaze:  3 Visual:  4 Facial Palsy:  5a Motor Arm - left: 1 5b Motor Arm - Right: 0 6a Motor Leg - Left: 2 6b Motor Leg - Right: 1 7 Limb Ataxia:  8 Sensory: 1 9 Best Language:  10 Dysarthria:  11 Extinct. and Inatten.:  TOTAL: 5  Baseline Pre morbid  modified Rankin scale of 3  ASSESSMENT/PLAN  [name] is a 89 y.o. female with history of  DVT, HTN, HLD, GERD, gout, stroke and obesity who presents with acute onset  altered mental status and right sided weakness. She was nearly nonverbal on arrival but did improve over the next 30 minutes, becoming oriented to place, time and situation  NIH on Admission 13  Acute Ischemic Infarct:  left thalamic  Etiology:  small vessel disease   Code Stroke CT head No acute abnormality. Small vessel disease. ASPECTS 10.   chronic left cerebellar infarct.  MRI  left thalamic infarct.  atrophy with moderately advanced chronic microvascular ischemic disease, with small remote left cerebellar and right thalamic infarcts. MRA   NO LVO  Carotid Doppler no significant extracranial stenosis.   2D Echo ejection fraction 65 to 70%.  Mild left atrial dilatation.   LDL 87 HgbA1c  6.0 VTE prophylaxis - heparin SQ aspirin  81 mg daily prior to admission, now on aspirin  81 mg daily and clopidogrel 75 mg daily for 3 weeks and then plavix alone. Therapy recommendations:  SNF Disposition:   SNF  Hx of Stroke/TIA  chronic left cerebellar infarct and right thalamic infarcts.  Hypertension Home meds:  amlodipine 10mg , IMDUR 60mg , losartan 100mg , atenolol 100mg   Stable Blood Pressure Goal: BP less than 220/110   Hyperlipidemia Home meds:  zetia 10mg ,  resumed in hospital LDL 87, goal < 70 Add crestor  40mg   Continue statin at discharge  Dysphagia Patient has post-stroke dysphagia, SLP consulted    Diet   Diet regular Fluid consistency: Thin   Advance diet as tolerated  Other Stroke Risk Factors Obesity, Body mass index is 45.41 kg/m., BMI >/= 30 associated with increased stroke risk, recommend weight loss, diet and exercise as appropriate    Other Active Problems GERD Neuropathy  Gout  Hospital day # 2  I have personally obtained history,examined this patient, reviewed notes, independently viewed imaging studies, participated in medical decision making and plan of care.ROS completed by me personally and pertinent positives fully documented  I have made any additions or clarifications directly to the above note. Agree with note above.  Patient appears to be neurologically improving.  Therapy is recommending skilled nursing facility.  Recommend Plavix for 3 weeks followed by Plavix alone and aggressive risk factor modification. Patient appears to be at risk for obstructive sleep apnea.  She has expressed interest in considering to participate in the sleep smart stroke prevention study.  I have discussed this with the patient's daughter and granddaughter and given them written information to review and decide.  It was made clear to the patient and family that study participation is voluntary and she can withdraw participation in the study at any point in the future  if not satisfied.  Patient will more ever get the same excellent medical care irrespective of whether she participates in the study or not.  No study specific procedure was done before patient signed an informed consent. Greater than 50% time during this 35-minute visit was spent in counseling and coordination of care about her stroke and discussion of sleep apnea risk and treatment options and answered questions Ardella Beaver, MD Medical Director [facility] Pager: [phone] 10/15/2023 1:46 PM  10/15/2023 1:43 PM   To contact Stroke Continuity provider, please refer to [email or web address]. After hours, contact General Neurology

## 2023-10-15 NOTE — Evaluation (Signed)
 Speech Language Pathology Evaluation Patient Details Name: Destiny Ruiz MRN: [id number] DOB: 07-08-1930 Today's Date: 10/15/2023 Time: 7829-5621 SLP Time Calculation (min) (ACUTE ONLY): 12 min  Problem List:  Patient Active Problem List   Diagnosis Date Noted   Acute CVA (cerebrovascular accident) (HCC) 10/13/2023   TIA (transient ischemic attack) 04/12/2018   Acute left-sided weakness 04/11/2018   History of DVT (deep vein thrombosis) 04/11/2018   GERD (gastroesophageal reflux disease) 04/11/2018   Gout 04/11/2018   Hyperlipidemia 04/11/2018   Hypertension 04/11/2018   Peripheral neuropathy 04/11/2018   Past Medical History:  Past Medical History:  Diagnosis Date   DVT (deep venous thrombosis) (HCC)    GERD (gastroesophageal reflux disease)    Gout    High cholesterol    Hypertension    Neuropathy    Past Surgical History:  Past Surgical History:  Procedure Laterality Date   ABDOMINAL HYSTERECTOMY     JOINT REPLACEMENT     HPI:  89 yo female presents to [facility] 6/14 with acute onset R weakness and AMS. MRI shows 1 cm acute ischemic nonhemorrhagic left thalamic infarct. PMHx of HTN, HLD, neuropathy, stroke and obesity.   Assessment / Plan / Recommendation Clinical Impression  Pt and granddaughter deny deficits re: speech-language-cognition with this stroke. Her speech is 100% intelligible and able to express her thoughts. Comprehension is intact and able to follow a 3 step command. She is oriented x 4 with adequate verbal problem solving abilities. She was given the memory subtest of the Cognistat and scored in the severe impairment range (storage, retrieval deficits). Pt was able to remember relevant information; recalled what she ate for breakfast and stated she was aware of recommendation for further rehab at SNF. Educated/discussed ways to facilitate memory and pt states she writes down her appointments. Do not recommend further ST in acute care. SLP at SNF can assess if  difficulties arise. Pt and granddaughter in agreement.    SLP Assessment  SLP Recommendation/Assessment: Patient does not need any further Speech Language Pathology Services SLP Visit Diagnosis: Cognitive communication deficit (R41.841)     Assistance Recommended at Discharge     Functional Status Assessment Patient has not had a recent decline in their functional status  Frequency and Duration           SLP Evaluation Cognition  Overall Cognitive Status: Impaired/Different from baseline (for memory subtest) Arousal/Alertness: Awake/alert Orientation Level: Oriented X4 Year: 2025 Month: June Day of Week: Correct Attention: Sustained Sustained Attention: Appears intact Memory: Impaired (recalled 0/4 words, 2/4 with semantic cues) Memory Impairment: Retrieval deficit;Storage deficit Awareness: Appears intact Problem Solving: Appears intact Safety/Judgment: Appears intact       Comprehension  Auditory Comprehension Overall Auditory Comprehension: Appears within functional limits for tasks assessed Commands: Within Functional Limits Visual Recognition/Discrimination Discrimination: Not tested Reading Comprehension Reading Status: Not tested    Expression Expression Primary Mode of Expression: Verbal Verbal Expression Overall Verbal Expression: Appears within functional limits for tasks assessed Initiation: No impairment Level of Generative/Spontaneous Verbalization: Conversation Repetition:  (NT) Naming: No impairment Pragmatics: No impairment Written Expression Written Expression: Not tested   Oral / Motor  Oral Motor/Sensory Function Overall Oral Motor/Sensory Function: Within functional limits Motor Speech Overall Motor Speech: Appears within functional limits for tasks assessed Respiration: Within functional limits Phonation: Normal Resonance: Within functional limits Articulation: Within functional limitis Intelligibility: Intelligible Motor Planning:  Within functional limits Motor Speech Errors: Not applicable            Wannetta Langland,  Nola Battiest 10/15/2023, 12:14 PM

## 2023-10-15 NOTE — Evaluation (Signed)
 Occupational Therapy Evaluation Patient Details Name: Destiny Ruiz MRN: [id number] DOB: [birth year] Today's Date: 10/15/2023   History of Present Illness   89 yo female presents to [facility] 6/14 with acute onset R weakness and AMS. MRI shows 1 cm acute ischemic nonhemorrhagic left thalamic infarct. PMHx of HTN, HLD, neuropathy, stroke and obesity.     Clinical Impressions PTA patient independent with Adls, mobility using rollator and managing simple meals at her independent living facility. Admitted for above and presents with problem list below, including generalized weakness, impaired balance, decreased activity tolerance.  She requires mod assist for bed mobility, mod assist to stand from EOB and min assist to step using RW to recliner, and setup to max assist for Adls. Pt reports feeling dizziness, BP stable during session- RN aware. Based on performance today, believe patient will best benefit from continued OT services acutely and after dc at inpatient setting with <3hrs/day to optimize independence, safety with ADLs and mobility.      If plan is discharge home, recommend the following:   Two people to help with walking and/or transfers;A lot of help with bathing/dressing/bathroom;Assistance with cooking/housework;Assist for transportation;Help with stairs or ramp for entrance     Functional Status Assessment   Patient has had a recent decline in their functional status and demonstrates the ability to make significant improvements in function in a reasonable and predictable amount of time.     Equipment Recommendations   Other (comment) (defer)     Recommendations for Other Services         Precautions/Restrictions   Precautions Precautions: Fall Recall of Precautions/Restrictions: Intact Restrictions Weight Bearing Restrictions Per Provider Order: No     Mobility Bed Mobility Overal bed mobility: Needs Assistance Bed Mobility: Supine to Sit, Sit to Supine      Supine to sit: Mod assist     General bed mobility comments: mod assist to fully scoot hips to EOB, increased time but pt managing LB towards EOB    Transfers Overall transfer level: Needs assistance Equipment used: Rolling walker (2 wheels) Transfers: Sit to/from Stand, Bed to chair/wheelchair/BSC Sit to Stand: Mod assist, From elevated surface     Step pivot transfers: Min assist     General transfer comment: assist for power up, steady; stepping to recliner with min assist      Balance Overall balance assessment: Needs assistance Sitting-balance support: No upper extremity supported, Feet supported Sitting balance-Leahy Scale: Fair     Standing balance support: Bilateral upper extremity supported, During functional activity, Reliant on assistive device for balance Standing balance-Leahy Scale: Poor                             ADL either performed or assessed with clinical judgement   ADL Overall ADL's : Needs assistance/impaired     Grooming: Set up;Sitting           Upper Body Dressing : Minimal assistance;Sitting   Lower Body Dressing: Maximal assistance;Sit to/from stand Lower Body Dressing Details (indicate cue type and reason): uses sock aide for socks, required assist today.  Mod assist to stand from EOB. Toilet Transfer: Moderate assistance;Minimal assistance;Ambulation;Rolling walker (2 wheels) Toilet Transfer Details (indicate cue type and reason): stand step from EOB to recliner with mod assist to stand and min assist to step         Functional mobility during ADLs: Moderate assistance       Vision   Vision  Assessment?: No apparent visual deficits     Perception         Praxis         Pertinent Vitals/Pain Pain Assessment Pain Assessment: No/denies pain     Extremity/Trunk Assessment Upper Extremity Assessment Upper Extremity Assessment: Generalized weakness (reports L sided weakness since CVA but grossly weak  overall)   Lower Extremity Assessment Lower Extremity Assessment: Defer to PT evaluation   Cervical / Trunk Assessment Cervical / Trunk Assessment: Kyphotic   Communication Communication Communication: No apparent difficulties   Cognition Arousal: Alert Behavior During Therapy: WFL for tasks assessed/performed Cognition: No apparent impairments             OT - Cognition Comments: appears WFL                 Following commands: Intact       Cueing  General Comments   Cueing Techniques: Verbal cues  BP 147/97 EOB, pt reporting dizziness; 159/60 once  sitting in recliner. HR 60-70 thoughout session. RN aware of BP and HR.   Exercises     Shoulder Instructions      Home Living Family/patient expects to be discharged to:: Private residence Living Arrangements: Alone Available Help at Discharge: Family;Friend(s);Available PRN/intermittently Type of Home: Independent living facility Home Access: Level entry;Elevator     Home Layout: One level     Bathroom Shower/Tub: Producer, television/film/video: Standard Bathroom Accessibility: Yes How Accessible: Accessible via walker Home Equipment: Rollator (4 wheels);Rolling Walker (2 wheels);Shower seat - built in;Lift chair;Wheelchair - power;Wheelchair - manual;BSC/3in1;Adaptive equipment Adaptive Equipment: Reacher;Sock aid;Long-handled shoe horn    Lives With: Alone    Prior Functioning/Environment Prior Level of Function : Independent/Modified Independent             Mobility Comments: walks with rollator in ILF, uses power wheelchair when outside and regular w/c if she goes out in car with family. Does not drive ADLs Comments: independent with dressing and bathing, has meals (microwave meals) in her apt. children get pt her groceries, and has assist from family for cleaning apt    OT Problem List: Decreased strength;Decreased activity tolerance;Impaired balance (sitting and/or standing);Decreased  knowledge of use of DME or AE;Decreased knowledge of precautions;Obesity   OT Treatment/Interventions: Self-care/ADL training;Therapeutic exercise;DME and/or AE instruction;Therapeutic activities;Cognitive remediation/compensation;Patient/family education;Balance training      OT Goals(Current goals can be found in the care plan section)   Acute Rehab OT Goals Patient Stated Goal: get better OT Goal Formulation: With patient Time For Goal Achievement: 10/29/23 Potential to Achieve Goals: Good   OT Frequency:  Min 2X/week    Co-evaluation              AM-PAC OT 6 Clicks Daily Activity     Outcome Measure Help from another person eating meals?: None Help from another person taking care of personal grooming?: A Little Help from another person toileting, which includes using toliet, bedpan, or urinal?: A Lot Help from another person bathing (including washing, rinsing, drying)?: A Lot Help from another person to put on and taking off regular upper body clothing?: A Little Help from another person to put on and taking off regular lower body clothing?: A Lot 6 Click Score: 16   End of Session Equipment Utilized During Treatment: Rolling walker (2 wheels);Gait belt Nurse Communication: Mobility status  Activity Tolerance: Patient tolerated treatment well Patient left: in chair;with call bell/phone within reach;with chair alarm set;with family/visitor present  OT Visit Diagnosis:  Other abnormalities of gait and mobility (R26.89);Muscle weakness (generalized) (M62.81)                Time: 1610-9604 OT Time Calculation (min): 35 min Charges:  OT General Charges $OT Visit: 1 Visit OT Evaluation $OT Eval Moderate Complexity: 1 Mod OT Treatments $Self Care/Home Management : 8-22 mins  Bary Boss, OT Acute Rehabilitation Services Office [phone] Secure Chat Preferred    Fredrich Jefferson 10/15/2023, 12:37 PM

## 2023-10-15 NOTE — TOC CAGE-AID Note (Signed)
 Transition of Care [facility]) - CAGE-AID Screening   Patient Details  Name: Destiny Ruiz MRN: [id number] Date of Birth: [birth year]  Transition of Care [facility]) CM/SW Contact:    Arin Peral E Najee Cowens, LCSW Phone Number: 10/15/2023, 12:28 PM   Clinical Narrative:    CAGE-AID Screening:    Have You Ever Felt You Ought to Cut Down on Your Drinking or Drug Use?: No Have People Annoyed You By [employer] Your Drinking Or Drug Use?: No Have You Felt Bad Or Guilty About Your Drinking Or Drug Use?: No Have You Ever Had a Drink or Used Drugs First Thing In The Morning to Steady Your Nerves or to Get Rid of a Hangover?: No CAGE-AID Score: 0  Substance Abuse Education Offered: No

## 2023-10-15 NOTE — Plan of Care (Signed)
  Problem: Education: Goal: Knowledge of disease or condition will improve Outcome: Progressing   Problem: Coping: Goal: Will verbalize positive feelings about self Outcome: Progressing Goal: Will identify appropriate support needs Outcome: Progressing   Problem: Health Behavior/Discharge Planning: Goal: Ability to manage health-related needs will improve Outcome: Progressing   Problem: Education: Goal: Knowledge of General Education information will improve Description: Including pain rating scale, medication(s)/side effects and non-pharmacologic comfort measures Outcome: Progressing   Problem: Health Behavior/Discharge Planning: Goal: Ability to manage health-related needs will improve Outcome: Progressing   Problem: Clinical Measurements: Goal: Ability to maintain clinical measurements within normal limits will improve Outcome: Progressing Goal: Will remain free from infection Outcome: Progressing Goal: Diagnostic test results will improve Outcome: Progressing Goal: Respiratory complications will improve Outcome: Progressing Goal: Cardiovascular complication will be avoided Outcome: Progressing   Problem: Nutrition: Goal: Adequate nutrition will be maintained Outcome: Progressing   Problem: Coping: Goal: Level of anxiety will decrease Outcome: Progressing   Problem: Elimination: Goal: Will not experience complications related to bowel motility Outcome: Progressing Goal: Will not experience complications related to urinary retention Outcome: Progressing   Problem: Pain Managment: Goal: General experience of comfort will improve and/or be controlled Outcome: Progressing   Problem: Safety: Goal: Ability to remain free from injury will improve Outcome: Progressing   Problem: Activity: Goal: Risk for activity intolerance will decrease Outcome: Not Progressing

## 2023-10-15 NOTE — NC FL2 (Signed)
 [place]  MEDICAID FL2 LEVEL OF CARE FORM     IDENTIFICATION  Patient Name: Destiny Ruiz Birthdate: 05-05-1930 Sex: female Admission Date (Current Location): 10/13/2023  [facility] and [place] Number:  Chiropodist and Address:  The . [facility], [address] [place], [place] [address]      Provider Number: [id number]  Attending Physician Name and Address:  Etter Hermann., *  Relative Name and Phone Number:       Current Level of Care: Hospital Recommended Level of Care: Skilled Nursing Facility Prior Approval Number:    Date Approved/Denied:   PASRR Number: [id number]  Discharge Plan: SNF    Current Diagnoses: Patient Active Problem List   Diagnosis Date Noted   Acute CVA (cerebrovascular accident) (HCC) 10/13/2023   TIA (transient ischemic attack) 04/12/2018   Acute left-sided weakness 04/11/2018   History of DVT (deep vein thrombosis) 04/11/2018   GERD (gastroesophageal reflux disease) 04/11/2018   Gout 04/11/2018   Hyperlipidemia 04/11/2018   Hypertension 04/11/2018   Peripheral neuropathy 04/11/2018    Orientation RESPIRATION BLADDER Height & Weight     Self, Time, Situation, Place  Normal Continent Weight: 264 lb 8.8 oz (120 kg) Height:  5' 4 (162.6 cm)  BEHAVIORAL SYMPTOMS/MOOD NEUROLOGICAL BOWEL NUTRITION STATUS      Continent Diet (regular)  AMBULATORY STATUS COMMUNICATION OF NEEDS Skin   Extensive Assist Verbally Normal                       Personal Care Assistance Level of Assistance  Bathing, Feeding, Dressing Bathing Assistance: Maximum assistance Feeding assistance: Limited assistance Dressing Assistance: Maximum assistance     Functional Limitations Info  Sight, Hearing Sight Info: Impaired Hearing Info: Impaired      SPECIAL CARE FACTORS FREQUENCY  PT (By licensed PT), OT (By licensed OT)     PT Frequency: 5x/wk OT Frequency: 5x/wk            Contractures Contractures Info:  Not present    Additional Factors Info  Code Status, Allergies Code Status Info: Full Allergies Info: Ace Inhibitors, Lipitor (Atorvastatin)           Current Medications (10/15/2023):  This is the current hospital active medication list Current Facility-Administered Medications  Medication Dose Route Frequency Provider Last Rate Last Admin   acetaminophen  (TYLENOL ) tablet 650 mg  650 mg Oral Q4H PRN Thomas, Sara-Maiz A, MD   650 mg at 10/15/23 5621   Or   acetaminophen  (TYLENOL ) 160 MG/5ML solution 650 mg  650 mg Per Tube Q4H PRN Sabas Cradle, MD       Or   acetaminophen  (TYLENOL ) suppository 650 mg  650 mg Rectal Q4H PRN Sabas Cradle, MD       amLODipine (NORVASC) tablet 10 mg  10 mg Oral Daily Etter Hermann., MD   10 mg at 10/15/23 1038   aspirin  chewable tablet 81 mg  81 mg Oral Daily de Thayne Fine, Cortney E, NP   81 mg at 10/15/23 3086   clopidogrel (PLAVIX) tablet 75 mg  75 mg Oral Daily de Thayne Fine, Cortney E, NP   75 mg at 10/15/23 5784   ezetimibe (ZETIA) tablet 10 mg  10 mg Oral Daily Thomas, Sara-Maiz A, MD   10 mg at 10/15/23 6962   ferrous sulfate tablet 325 mg  325 mg Oral Once per day on Monday Wednesday Friday Sabas Cradle, MD  325 mg at 10/15/23 0819   heparin injection 5,000 Units  5,000 Units Subcutaneous Q8H Tera Fellows A, MD   5,000 Units at 10/15/23 4098   pantoprazole (PROTONIX) EC tablet 40 mg  40 mg Oral Daily Thomas, Sara-Maiz A, MD   40 mg at 10/15/23 1191   pneumococcal 20-valent conjugate vaccine (PREVNAR 20) injection 0.5 mL  0.5 mL Intramuscular Tomorrow-1000 Etter Hermann., MD       rosuvastatin  (CRESTOR ) tablet 40 mg  40 mg Oral Daily Thomas, Sara-Maiz A, MD   40 mg at 10/15/23 4782   senna-docusate (Senokot-S) tablet 1 tablet  1 tablet Oral QHS PRN Sabas Cradle, MD         Discharge Medications: Please see discharge summary for a list of discharge medications.  Relevant Imaging Results:  Relevant  Lab Results:   Additional Information SS#: [id number]  Tandy Fam, LCSW

## 2023-10-15 NOTE — Plan of Care (Signed)

## 2023-10-15 NOTE — TOC Initial Note (Signed)
 Transition of Care [facility]) - Initial/Assessment Note    Patient Details  Name: Destiny Ruiz MRN: [id number] Date of Birth: 06/29/1930  [facility]) CM/SW Contact:    Tandy Fam, LCSW Phone Number: 10/15/2023, 11:20 AM  Clinical Narrative:    Patient and family in agreement with recommendation for SNF. Patient's daughter works at [employer], would prefer to go there if possible. CSW completed referral, [facility] has a bed available when patient is stable. CSW to follow.               Expected Discharge Plan: Skilled Nursing Facility Barriers to Discharge: Continued Medical Work up, English as a second language teacher   Patient Goals and CMS Choice Patient states their goals for this hospitalization and ongoing recovery are:: to get rehab CMS Medicare.gov Compare Post Acute Care list provided to:: Patient Represenative (must comment) Choice offered to / list presented to : Adult Children [place] ownership interest in [facility]provided to:: Adult Children    Expected Discharge Plan and Services     Post Acute Care Choice: Skilled Nursing Facility Living arrangements for the past 2 months: Apartment                                      Prior Living Arrangements/Services Living arrangements for the past 2 months: Apartment Lives with:: Self Patient language and need for interpreter reviewed:: No Do you feel safe going back to the place where you live?: Yes      Need for Family Participation in Patient Care: Yes (Comment) Care giver support system in place?: No (comment)   Criminal Activity/Legal Involvement Pertinent to Current Situation/Hospitalization: No - Comment as needed  Activities of Daily Living   ADL Screening (condition at time of admission) Independently performs ADLs?: No Does the patient have a NEW difficulty with bathing/dressing/toileting/self-feeding that is expected to last >3 days?: Yes  (Initiates electronic notice to provider for possible OT consult) Does the patient have a NEW difficulty with getting in/out of bed, walking, or climbing stairs that is expected to last >3 days?: Yes (Initiates electronic notice to provider for possible PT consult) Does the patient have a NEW difficulty with communication that is expected to last >3 days?: No Is the patient deaf or have difficulty hearing?: No Does the patient have difficulty seeing, even when wearing glasses/contacts?: Yes Does the patient have difficulty concentrating, remembering, or making decisions?: No  Permission Sought/Granted Permission sought to share information with : Facility Medical sales representative, Family Supports Permission granted to share information with : Yes, Verbal Permission Granted  Share Information with NAME: Danise Durie  Permission granted to share info w AGENCY: SNF  Permission granted to share info w Relationship: Granddaughter, Daughter     Emotional Assessment Appearance:: Appears stated age Attitude/Demeanor/Rapport: Engaged Affect (typically observed): Appropriate Orientation: : Oriented to Self, Oriented to Place, Oriented to  Time, Oriented to Situation Alcohol / Substance Use: Not Applicable Psych Involvement: No (comment)  Admission diagnosis:  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) [I63.10] Acute CVA (cerebrovascular accident) [facility]) [I63.9] Patient Active Problem List   Diagnosis Date Noted   Acute CVA (cerebrovascular accident) (HCC) 10/13/2023   TIA (transient ischemic attack) 04/12/2018   Acute left-sided weakness 04/11/2018   History of DVT (deep vein thrombosis) 04/11/2018   GERD (gastroesophageal reflux disease) 04/11/2018   Gout 04/11/2018   Hyperlipidemia  04/11/2018   Hypertension 04/11/2018   Peripheral neuropathy 04/11/2018   PCP:  Comer Decamp, MD Pharmacy:   [facility] - [place], [place] - [address] [place] [address] Phone: [phone] Fax: [phone]     Social Drivers of Health (SDOH) Social History: SDOH Screenings   Food Insecurity: No Food Insecurity (10/14/2023)  Housing: Low Risk  (10/14/2023)  Transportation Needs: No Transportation Needs (10/14/2023)  Utilities: Not At Risk (10/14/2023)  Social Connections: Moderately Integrated (10/14/2023)  Tobacco Use: Low Risk  (10/13/2023)   SDOH Interventions:     Readmission Risk Interventions     No data to display

## 2023-10-15 NOTE — Progress Notes (Signed)
 PROGRESS NOTE    Destiny Ruiz  BJY:[id number] DOB: Oct 27, 1930 DOA: 10/13/2023 PCP: Comer Decamp, MD  Chief Complaint  Patient presents with   Code Stroke    Brief Narrative:   Destiny Ruiz is Destiny Ruiz 89 y.o. female with medical history significant of DVT, GERD, Gout, Essential Hypertension, Neuropathy ,CVA,who presents to ED with complaint of acute onset of right sided facial droop, arm weakness and leg weakness, headache as well as confusion. Patient  LSW was 0730 this am and ambulance was called at 1630. Patient notes does not feel well, currently complaining of HA. She notes she still feel weak and she noted both arms feel weak. She notes no n/v/d /f/c/or abdominal pain. She notes no new weakness in her legs.   Assessment & Plan:   Principal Problem:   Acute CVA (cerebrovascular accident) (HCC)  Acute Stroke -MRI with 1 cm acute ischemic nonhemorrhagic L thalamic infarct -MRA without LVO or other emergent findings.  No hemodynamically significant or correctable stenosis.  Negative MRA of neck.  -A1c 6/LDL 87 -neurology suspects due to small vessel disease, recommending DAPTx3 weeks followed by plavix.  Crestor  40 mg.  - echo with EF 65-70%, no RWMA, grade II diastolic dysfunction (see report) - carotid US  with near normal carotids bilaterally - PT/OT/SLP recommending SNF   HLD -continue with statin and zetia    GERD -ppi   Gout -continue allopurinol     Essential Hypertension Will gradually resume home meds - amlodipine Holding atenolol (HR on slow side), imdur, and cozaar for now    Neuropathy ,nos  - continue gabapentin   DVT -s/p tx    DVT prophylaxis: heparin  Code Status: full Family Communication: granddaughter Disposition:   Status is: Inpatient Remains inpatient appropriate because: need for inpatient care   Consultants:  neurology  Procedures:  Echo IMPRESSIONS     1. Left ventricular ejection fraction, by estimation, is 65 to 70%. The   left ventricle has normal function. The left ventricle has no regional  wall motion abnormalities. There is mild left ventricular hypertrophy.  Left ventricular diastolic parameters  are consistent with Grade II diastolic dysfunction (pseudonormalization).  Elevated left atrial pressure. The average left ventricular global  longitudinal strain is -22.0 %. The global longitudinal strain is normal.   2. Right ventricular systolic function is normal. The right ventricular  size is mildly enlarged. Tricuspid regurgitation signal is inadequate for  assessing PA pressure.   3. Left atrial size was mildly dilated.   4. The mitral valve is normal in structure. Trivial mitral valve  regurgitation. No evidence of mitral stenosis.   5. The aortic valve is tricuspid. Aortic valve regurgitation is not  visualized. Aortic valve sclerosis is present, with no evidence of aortic  valve stenosis.   6. The inferior vena cava is normal in size with greater than 50%  respiratory variability, suggesting right atrial pressure of 3 mmHg.   Antimicrobials:  Anti-infectives (From admission, onward)    None       Subjective: No complaints other than back pain from bed  Objective: Vitals:   10/14/23 1956 10/15/23 0033 10/15/23 0614 10/15/23 0727  BP: (!) 158/84 (!) 151/71 (!) 169/78 (!) 153/71  Pulse: (!) 59 (!) 59 (!) 59 (!) 54  Resp: 16 15 18 19   Temp: 98.7 F (37.1 C) 97.9 F (36.6 C) 98.3 F (36.8 C)   TempSrc: Oral Axillary Oral   SpO2: 95% 96% 99% 100%  Weight:  Height:        Intake/Output Summary (Last 24 hours) at 10/15/2023 1000 Last data filed at 10/15/2023 0600 Gross per 24 hour  Intake 556.84 ml  Output 2000 ml  Net -1443.16 ml   Filed Weights   10/13/23 1700 10/13/23 2017  Weight: 120 kg 120 kg    Examination:  General: No acute distress. Cardiovascular: RRR Lungs: unlabored Neurological: L sided weakness Extremities: chronic lymphedema   Data Reviewed: I have  personally reviewed following labs and imaging studies  CBC: Recent Labs  Lab 10/13/23 2159 10/13/23 2221 10/14/23 0100 10/14/23 1520 10/15/23 0438  WBC  --  6.7 6.3 6.6 6.1  NEUTROABS  --  4.3  --   --  3.6  HGB 13.3 11.7* 11.4* 11.4* 11.1*  HCT 39.0 39.1 37.0 36.7 36.3  MCV  --  87.7 88.3 86.6 86.4  PLT  --  191 205 173 173    Basic Metabolic Panel: Recent Labs  Lab 10/13/23 2159 10/13/23 2221 10/14/23 0100 10/14/23 1520 10/15/23 0438  NA 143 142  --  139 141  K 4.3 4.3  --  4.5 4.3  CL 103 106  --  105 105  CO2  --  26  --  25 29  GLUCOSE 87 94  --  110* 95  BUN 15 13  --  14 16  CREATININE 0.80 0.78 0.81 1.05* 0.94  CALCIUM   --  9.0  --  8.9 8.8*  MG  --   --   --   --  1.9  PHOS  --   --   --   --  4.3    GFR: Estimated Creatinine Clearance: 50.7 mL/min (by C-G formula based on SCr of 0.94 mg/dL).  Liver Function Tests: Recent Labs  Lab 10/13/23 2221 10/15/23 0438  AST 16 17  ALT 10 9  ALKPHOS 41 32*  BILITOT 0.7 0.8  PROT 6.9 6.7  ALBUMIN 3.3* 2.8*    CBG: Recent Labs  Lab 10/13/23 1744  GLUCAP 88     No results found for this or any previous visit (from the past 240 hours).       Radiology Studies: VAS US  CAROTID Result Date: 10/15/2023 Carotid Arterial Duplex Study Patient Name:  Destiny Ruiz  Date of Exam:   10/14/2023 Medical Rec #: [id number]          Accession #:    [id number] Date of Birth: 1930/05/08          Patient Gender: F Patient Age:   89 years Exam Location:  [facility] Procedure:      VAS US  CAROTID Referring Phys: Margart Shears DE LA TORRE --------------------------------------------------------------------------------  Indications:       CVA, Weakness and Altered mental status. Risk Factors:      Hypertension, hyperlipidemia. Limitations        Today's exam was limited due to the high bifurcation of the                    carotid, the patient's respiratory variation and the body                    habitus of the  patient. Comparison Study:  No prior study on file Performing Technologist: Carleene Chase RVS  Examination Guidelines: Bernyce Brimley complete evaluation includes B-mode imaging, spectral Doppler, color Doppler, and power Doppler as needed of all accessible portions of each vessel. Bilateral testing is considered an integral part of  Tadarrius Burch complete examination. Limited examinations for reoccurring indications may be performed as noted.  Right Carotid Findings: +----------+--------+--------+--------+------------------+--------+           PSV cm/sEDV cm/sStenosisPlaque DescriptionComments +----------+--------+--------+--------+------------------+--------+ CCA Prox  98      21                                         +----------+--------+--------+--------+------------------+--------+ CCA Distal101     13                                         +----------+--------+--------+--------+------------------+--------+ ICA Prox  74      15              calcific                   +----------+--------+--------+--------+------------------+--------+ ICA Mid   81      13                                         +----------+--------+--------+--------+------------------+--------+ ICA Distal72      15                                tortuous +----------+--------+--------+--------+------------------+--------+ ECA       94      5                                          +----------+--------+--------+--------+------------------+--------+ +----------+--------+-------+--------+-------------------+           PSV cm/sEDV cmsDescribeArm Pressure (mmHG) +----------+--------+-------+--------+-------------------+ [id number]                                        +----------+--------+-------+--------+-------------------+ +---------+--------+--+--------+--+ VertebralPSV cm/s92EDV cm/s17 +---------+--------+--+--------+--+  Left Carotid Findings:  +----------+--------+--------+--------+------------------+--------+           PSV cm/sEDV cm/sStenosisPlaque DescriptionComments +----------+--------+--------+--------+------------------+--------+ CCA Prox  67      15                                         +----------+--------+--------+--------+------------------+--------+ CCA Distal58      12                                         +----------+--------+--------+--------+------------------+--------+ ICA Prox  107     13                                tortuous +----------+--------+--------+--------+------------------+--------+ ICA Mid   88      23                                tortuous +----------+--------+--------+--------+------------------+--------+ ICA Distal59  10                                tortuous +----------+--------+--------+--------+------------------+--------+ ECA       111     2                                 tortuous +----------+--------+--------+--------+------------------+--------+ +----------+--------+--------+--------+-------------------+           PSV cm/sEDV cm/sDescribeArm Pressure (mmHG) +----------+--------+--------+--------+-------------------+ [id number]                                          +----------+--------+--------+--------+-------------------+ +---------+--------+---+--------+--+ VertebralPSV cm/s111EDV cm/s19 +---------+--------+---+--------+--+   Summary: Right Carotid: The extracranial vessels were near-normal with only minimal wall                thickening or plaque. Left Carotid: The extracranial vessels were near-normal with only minimal wall               thickening or plaque. Vertebrals:  Bilateral vertebral arteries demonstrate antegrade flow. Subclavians: Normal flow hemodynamics were seen in bilateral subclavian              arteries. *See table(s) above for measurements and observations.  Electronically signed by Ardella Beaver MD on 10/15/2023  at 7:46:07 AM.    Final    ECHOCARDIOGRAM COMPLETE Result Date: 10/14/2023    ECHOCARDIOGRAM REPORT   Patient Name:   SEVANNA BALLENGEE Date of Exam: 10/14/2023 Medical Rec #:  [id number]         Height:       64.0 in Accession #:    [id number]        Weight:       264.6 lb Date of Birth:  04-25-1931         BSA:          2.203 m Patient Age:    89 years          BP:           126/93 mmHg Patient Gender: F                 HR:           53 bpm. Exam Location:  Inpatient Procedure: 2D Echo, 3D Echo, Cardiac Doppler, Color Doppler, Strain Analysis and            Intracardiac Opacification Agent (Both Spectral and Color Flow            Doppler were utilized during procedure). Indications:    Stroke  History:        Patient has prior history of Echocardiogram examinations, most                 recent 04/12/2018. DVT; Risk Factors:Hypertension and                 Dyslipidemia. GERD.  Sonographer:    Travis Friedman RDCS Referring Phys: [id number] CORTNEY E DE LA TORRE IMPRESSIONS  1. Left ventricular ejection fraction, by estimation, is 65 to 70%. The left ventricle has normal function. The left ventricle has no regional wall motion abnormalities. There is mild left ventricular hypertrophy. Left ventricular diastolic parameters are consistent with Grade II diastolic dysfunction (pseudonormalization). Elevated left atrial pressure. The average left ventricular  global longitudinal strain is -22.0 %. The global longitudinal strain is normal.  2. Right ventricular systolic function is normal. The right ventricular size is mildly enlarged. Tricuspid regurgitation signal is inadequate for assessing PA pressure.  3. Left atrial size was mildly dilated.  4. The mitral valve is normal in structure. Trivial mitral valve regurgitation. No evidence of mitral stenosis.  5. The aortic valve is tricuspid. Aortic valve regurgitation is not visualized. Aortic valve sclerosis is present, with no evidence of aortic valve stenosis.  6. The inferior  vena cava is normal in size with greater than 50% respiratory variability, suggesting right atrial pressure of 3 mmHg. FINDINGS  Left Ventricle: Left ventricular ejection fraction, by estimation, is 65 to 70%. The left ventricle has normal function. The left ventricle has no regional wall motion abnormalities. The average left ventricular global longitudinal strain is -22.0 %. Strain was performed and the global longitudinal strain is normal. 3D ejection fraction reviewed and evaluated as part of the interpretation. Alternate measurement of EF is felt to be most reflective of LV function. The left ventricular internal cavity size was normal in size. There is mild left ventricular hypertrophy. Left ventricular diastolic parameters are consistent with Grade II diastolic dysfunction (pseudonormalization). Elevated left atrial pressure. Right Ventricle: The right ventricular size is mildly enlarged. No increase in right ventricular wall thickness. Right ventricular systolic function is normal. Tricuspid regurgitation signal is inadequate for assessing PA pressure. Left Atrium: Left atrial size was mildly dilated. Right Atrium: Right atrial size was normal in size. Pericardium: Trivial pericardial effusion is present. Mitral Valve: The mitral valve is normal in structure. Trivial mitral valve regurgitation. No evidence of mitral valve stenosis. Tricuspid Valve: The tricuspid valve is normal in structure. Tricuspid valve regurgitation is trivial. Aortic Valve: The aortic valve is tricuspid. Aortic valve regurgitation is not visualized. Aortic valve sclerosis is present, with no evidence of aortic valve stenosis. Aortic valve mean gradient measures 5.0 mmHg. Aortic valve peak gradient measures 9.9  mmHg. Aortic valve area, by VTI measures 2.70 cm. Pulmonic Valve: The pulmonic valve was not well visualized. Pulmonic valve regurgitation is not visualized. Aorta: The aortic root and ascending aorta are structurally normal,  with no evidence of dilitation. Venous: The inferior vena cava is normal in size with greater than 50% respiratory variability, suggesting right atrial pressure of 3 mmHg. IAS/Shunts: The interatrial septum was not well visualized. Additional Comments: 3D was performed not requiring image post processing on an independent workstation and was normal.  LEFT VENTRICLE PLAX 2D LVIDd:         4.50 cm      Diastology LVIDs:         2.10 cm      LV e' medial:    6.85 cm/s LV PW:         1.00 cm      LV E/e' medial:  13.8 LV IVS:        0.90 cm      LV e' lateral:   6.85 cm/s LVOT diam:     2.30 cm      LV E/e' lateral: 13.8 LV SV:         113 LV SV Index:   51           2D Longitudinal Strain LVOT Area:     4.15 cm     2D Strain GLS Avg:     -22.0 %  LV Volumes (MOD) LV vol d, MOD A2C: 123.0 ml  3D Volume EF: LV vol d, MOD A4C: 161.0 ml 3D EF:        64 % LV vol s, MOD A2C: 37.4 ml  LV EDV:       116 ml LV vol s, MOD A4C: 49.4 ml  LV ESV:       42 ml LV SV MOD A2C:     85.6 ml  LV SV:        74 ml LV SV MOD A4C:     161.0 ml LV SV MOD BP:      107.7 ml RIGHT VENTRICLE             IVC RV Basal diam:  4.20 cm     IVC diam: 1.60 cm RV Mid diam:    2.10 cm RV S prime:     14.50 cm/s TAPSE (M-mode): 3.1 cm LEFT ATRIUM             Index        RIGHT ATRIUM           Index LA diam:        3.80 cm 1.72 cm/m   RA Area:     15.80 cm LA Vol (A2C):   89.2 ml 40.48 ml/m  RA Volume:   36.60 ml  16.61 ml/m LA Vol (A4C):   61.7 ml 28.00 ml/m LA Biplane Vol: 79.5 ml 36.08 ml/m  AORTIC VALVE AV Area (Vmax):    2.86 cm AV Area (Vmean):   2.76 cm AV Area (VTI):     2.70 cm AV Vmax:           157.00 cm/s AV Vmean:          105.000 cm/s AV VTI:            0.417 m AV Peak Grad:      9.9 mmHg AV Mean Grad:      5.0 mmHg LVOT Vmax:         108.00 cm/s LVOT Vmean:        69.700 cm/s LVOT VTI:          0.271 m LVOT/AV VTI ratio: 0.65  AORTA Ao Root diam: 2.80 cm Ao Asc diam:  3.40 cm MITRAL VALVE MV Area (PHT): 3.08 cm     SHUNTS MV Decel  Time: 246 msec     Systemic VTI:  0.27 m MV E velocity: 94.20 cm/s   Systemic Diam: 2.30 cm MV Jocelyn Lowery velocity: 101.00 cm/s MV E/Cullen Vanallen ratio:  0.93 Carson Clara MD Electronically signed by Carson Clara MD Signature Date/Time: 10/14/2023/12:58:49 PM    Final    DG CHEST PORT 1 VIEW Result Date: 10/13/2023 CLINICAL DATA:  Cerebrovascular accident EXAM: PORTABLE CHEST 1 VIEW COMPARISON:  04/11/2018 FINDINGS: Low lung volumes accentuate cardiac silhouette and pulmonary vascularity. Question basilar infiltrates. No pleural effusion or pneumothorax. No displaced rib fractures. IMPRESSION: Low lung volumes with basilar infiltrates versus atelectasis. Electronically Signed   By: Rozell Cornet M.D.   On: 10/13/2023 23:25   MR BRAIN WO CONTRAST Result Date: 10/13/2023 CLINICAL DATA:  Initial evaluation for acute neuro deficit, stroke suspected. EXAM: MRI HEAD WITHOUT CONTRAST MRA HEAD WITHOUT CONTRAST MRA NECK WITHOUT CONTRAST TECHNIQUE: Multiplanar, multiecho pulse sequences of the brain and surrounding structures were obtained without intravenous contrast. Angiographic images of the Circle of Willis were obtained using MRA technique without intravenous contrast. Angiographic images of the neck were obtained using MRA technique without intravenous contrast. Carotid stenosis measurements (when  applicable) are obtained utilizing NASCET criteria, using the distal internal carotid diameter as the denominator. COMPARISON:  Comparison made with prior CT from earlier the same day. FINDINGS: MRI HEAD FINDINGS Brain: Generalized age-related cerebral atrophy. Patchy T2/FLAIR hyperintensity involving the periventricular deep white matter both cerebral hemispheres, consistent chronic small vessel ischemic disease, moderately advanced in nature. Small remote left cerebellar infarct noted. Chronic lacunar infarct at the right thalamus. 1 cm acute ischemic nonhemorrhagic infarcts seen involving the medial left thalamus. No  significant mass effect. No other acute or subacute ischemia. No acute intracranial hemorrhage. Few punctate chronic micro hemorrhages noted, likely small vessel related. No mass lesion, midline shift or mass effect. No hydrocephalus or extra-axial fluid collection. Pituitary gland within normal limits. Vascular: Major intracranial vascular flow voids are maintained. Skull and upper cervical spine: Craniocervical junction within normal limits. Bone marrow signal intensity within normal limits. No scalp soft tissue abnormality. Sinuses/Orbits: Prior bilateral ocular lens replacement. Paranasal sinuses are largely clear. Small right mastoid effusion noted, of doubtful significance. Other: None. MRA HEAD FINDINGS ANTERIOR CIRCULATION: Examination mildly degraded by motion artifact. Both internal carotid arteries are patent through the siphons without stenosis or other abnormality. A1 segments patent bilaterally. Normal anterior communicating artery complex. Anterior cerebral arteries patent without stenosis. No M1 stenosis or occlusion. MCA branches perfused and fairly symmetric. POSTERIOR CIRCULATION: Vertebral arteries are largely code dominant and patent without stenosis. Right PICA patent. Left PICA origin not seen. Basilar patent without stenosis. Superior cerebellar and posterior cerebral arteries patent bilaterally. No intracranial aneurysm. MRA NECK FINDINGS AORTIC ARCH: Examination technically limited by motion and lack of IV contrast. Aortic arch and origin the great vessels not well assessed on this exam. RIGHT CAROTID SYSTEM: Visualized right common and internal carotid arteries are patent with antegrade flow. No visible dissection. No hemodynamically significant stenosis about the right carotid artery system. LEFT CAROTID SYSTEM: Visualized left common and internal carotid arteries are patent with antegrade flow. No visible dissection. No hemodynamically significant stenosis about the left carotid artery  system. VERTEBRAL ARTERIES: Both vertebral arteries appear to arise from the subclavian arteries. Vertebral arteries are patent with antegrade flow. No visible dissection or stenosis. Other: None. IMPRESSION: MRI HEAD: 1. 1 cm acute ischemic nonhemorrhagic left thalamic infarct. 2. Underlying age-related cerebral atrophy with moderately advanced chronic microvascular ischemic disease, with small remote left cerebellar and right thalamic infarcts. MRA HEAD: Negative intracranial MRA for large vessel occlusion or other emergent finding. No hemodynamically significant or correctable stenosis. MRA NECK: Negative MRA of the neck. No hemodynamically significant stenosis or other acute vascular abnormality. Electronically Signed   By: Virgia Griffins M.D.   On: 10/13/2023 19:43   MR ANGIO HEAD WO CONTRAST Result Date: 10/13/2023 CLINICAL DATA:  Initial evaluation for acute neuro deficit, stroke suspected. EXAM: MRI HEAD WITHOUT CONTRAST MRA HEAD WITHOUT CONTRAST MRA NECK WITHOUT CONTRAST TECHNIQUE: Multiplanar, multiecho pulse sequences of the brain and surrounding structures were obtained without intravenous contrast. Angiographic images of the Circle of Willis were obtained using MRA technique without intravenous contrast. Angiographic images of the neck were obtained using MRA technique without intravenous contrast. Carotid stenosis measurements (when applicable) are obtained utilizing NASCET criteria, using the distal internal carotid diameter as the denominator. COMPARISON:  Comparison made with prior CT from earlier the same day. FINDINGS: MRI HEAD FINDINGS Brain: Generalized age-related cerebral atrophy. Patchy T2/FLAIR hyperintensity involving the periventricular deep white matter both cerebral hemispheres, consistent chronic small vessel ischemic disease, moderately advanced in nature. Small remote left  cerebellar infarct noted. Chronic lacunar infarct at the right thalamus. 1 cm acute ischemic  nonhemorrhagic infarcts seen involving the medial left thalamus. No significant mass effect. No other acute or subacute ischemia. No acute intracranial hemorrhage. Few punctate chronic micro hemorrhages noted, likely small vessel related. No mass lesion, midline shift or mass effect. No hydrocephalus or extra-axial fluid collection. Pituitary gland within normal limits. Vascular: Major intracranial vascular flow voids are maintained. Skull and upper cervical spine: Craniocervical junction within normal limits. Bone marrow signal intensity within normal limits. No scalp soft tissue abnormality. Sinuses/Orbits: Prior bilateral ocular lens replacement. Paranasal sinuses are largely clear. Small right mastoid effusion noted, of doubtful significance. Other: None. MRA HEAD FINDINGS ANTERIOR CIRCULATION: Examination mildly degraded by motion artifact. Both internal carotid arteries are patent through the siphons without stenosis or other abnormality. A1 segments patent bilaterally. Normal anterior communicating artery complex. Anterior cerebral arteries patent without stenosis. No M1 stenosis or occlusion. MCA branches perfused and fairly symmetric. POSTERIOR CIRCULATION: Vertebral arteries are largely code dominant and patent without stenosis. Right PICA patent. Left PICA origin not seen. Basilar patent without stenosis. Superior cerebellar and posterior cerebral arteries patent bilaterally. No intracranial aneurysm. MRA NECK FINDINGS AORTIC ARCH: Examination technically limited by motion and lack of IV contrast. Aortic arch and origin the great vessels not well assessed on this exam. RIGHT CAROTID SYSTEM: Visualized right common and internal carotid arteries are patent with antegrade flow. No visible dissection. No hemodynamically significant stenosis about the right carotid artery system. LEFT CAROTID SYSTEM: Visualized left common and internal carotid arteries are patent with antegrade flow. No visible dissection. No  hemodynamically significant stenosis about the left carotid artery system. VERTEBRAL ARTERIES: Both vertebral arteries appear to arise from the subclavian arteries. Vertebral arteries are patent with antegrade flow. No visible dissection or stenosis. Other: None. IMPRESSION: MRI HEAD: 1. 1 cm acute ischemic nonhemorrhagic left thalamic infarct. 2. Underlying age-related cerebral atrophy with moderately advanced chronic microvascular ischemic disease, with small remote left cerebellar and right thalamic infarcts. MRA HEAD: Negative intracranial MRA for large vessel occlusion or other emergent finding. No hemodynamically significant or correctable stenosis. MRA NECK: Negative MRA of the neck. No hemodynamically significant stenosis or other acute vascular abnormality. Electronically Signed   By: Virgia Griffins M.D.   On: 10/13/2023 19:43   MR ANGIO NECK WO CONTRAST Result Date: 10/13/2023 CLINICAL DATA:  Initial evaluation for acute neuro deficit, stroke suspected. EXAM: MRI HEAD WITHOUT CONTRAST MRA HEAD WITHOUT CONTRAST MRA NECK WITHOUT CONTRAST TECHNIQUE: Multiplanar, multiecho pulse sequences of the brain and surrounding structures were obtained without intravenous contrast. Angiographic images of the Circle of Willis were obtained using MRA technique without intravenous contrast. Angiographic images of the neck were obtained using MRA technique without intravenous contrast. Carotid stenosis measurements (when applicable) are obtained utilizing NASCET criteria, using the distal internal carotid diameter as the denominator. COMPARISON:  Comparison made with prior CT from earlier the same day. FINDINGS: MRI HEAD FINDINGS Brain: Generalized age-related cerebral atrophy. Patchy T2/FLAIR hyperintensity involving the periventricular deep white matter both cerebral hemispheres, consistent chronic small vessel ischemic disease, moderately advanced in nature. Small remote left cerebellar infarct noted. Chronic  lacunar infarct at the right thalamus. 1 cm acute ischemic nonhemorrhagic infarcts seen involving the medial left thalamus. No significant mass effect. No other acute or subacute ischemia. No acute intracranial hemorrhage. Few punctate chronic micro hemorrhages noted, likely small vessel related. No mass lesion, midline shift or mass effect. No hydrocephalus or extra-axial fluid collection.  Pituitary gland within normal limits. Vascular: Major intracranial vascular flow voids are maintained. Skull and upper cervical spine: Craniocervical junction within normal limits. Bone marrow signal intensity within normal limits. No scalp soft tissue abnormality. Sinuses/Orbits: Prior bilateral ocular lens replacement. Paranasal sinuses are largely clear. Small right mastoid effusion noted, of doubtful significance. Other: None. MRA HEAD FINDINGS ANTERIOR CIRCULATION: Examination mildly degraded by motion artifact. Both internal carotid arteries are patent through the siphons without stenosis or other abnormality. A1 segments patent bilaterally. Normal anterior communicating artery complex. Anterior cerebral arteries patent without stenosis. No M1 stenosis or occlusion. MCA branches perfused and fairly symmetric. POSTERIOR CIRCULATION: Vertebral arteries are largely code dominant and patent without stenosis. Right PICA patent. Left PICA origin not seen. Basilar patent without stenosis. Superior cerebellar and posterior cerebral arteries patent bilaterally. No intracranial aneurysm. MRA NECK FINDINGS AORTIC ARCH: Examination technically limited by motion and lack of IV contrast. Aortic arch and origin the great vessels not well assessed on this exam. RIGHT CAROTID SYSTEM: Visualized right common and internal carotid arteries are patent with antegrade flow. No visible dissection. No hemodynamically significant stenosis about the right carotid artery system. LEFT CAROTID SYSTEM: Visualized left common and internal carotid arteries  are patent with antegrade flow. No visible dissection. No hemodynamically significant stenosis about the left carotid artery system. VERTEBRAL ARTERIES: Both vertebral arteries appear to arise from the subclavian arteries. Vertebral arteries are patent with antegrade flow. No visible dissection or stenosis. Other: None. IMPRESSION: MRI HEAD: 1. 1 cm acute ischemic nonhemorrhagic left thalamic infarct. 2. Underlying age-related cerebral atrophy with moderately advanced chronic microvascular ischemic disease, with small remote left cerebellar and right thalamic infarcts. MRA HEAD: Negative intracranial MRA for large vessel occlusion or other emergent finding. No hemodynamically significant or correctable stenosis. MRA NECK: Negative MRA of the neck. No hemodynamically significant stenosis or other acute vascular abnormality. Electronically Signed   By: Virgia Griffins M.D.   On: 10/13/2023 19:43   CT HEAD CODE STROKE WO CONTRAST Result Date: 10/13/2023 CLINICAL DATA:  Code stroke. Neuro deficit, acute, stroke suspected. Right-sided weakness, slurred speech, and altered mental status. EXAM: CT HEAD WITHOUT CONTRAST TECHNIQUE: Contiguous axial images were obtained from the base of the skull through the vertex without intravenous contrast. RADIATION DOSE REDUCTION: This exam was performed according to the departmental dose-optimization program which includes automated exposure control, adjustment of the mA and/or kV according to patient size and/or use of iterative reconstruction technique. COMPARISON:  Head MRI 12/13/2022 FINDINGS: Brain: There is no evidence of an acute infarct, intracranial hemorrhage, mass, midline shift, or extra-axial fluid collection. Mild cerebral atrophy is within limits for age. Patchy cerebral white matter hypodensities are nonspecific but compatible with mild-to-moderate chronic small vessel ischemic disease. Jaymin Waln small chronic left cerebellar infarct is new. Vascular: No hyperdense  vessel. Skull: No acute fracture or suspicious lesion. Sinuses/Orbits: Mild mucosal thickening/small mucous retention cyst inferiorly in the right maxillary sinus. Chronic left mastoid effusion. Bilateral cataract extraction. Other: None. ASPECTS ([place] Stroke Program Early CT Score) - Ganglionic level infarction (caudate, lentiform nuclei, internal capsule, insula, M1-M3 cortex): 7 - Supraganglionic infarction (M4-M6 cortex): 3 Total score (0-10 with 10 being normal): 10 These results were communicated to Dr. Lindzen at 6:01 pm on 10/13/2023 by text page via the [facility] messaging system. IMPRESSION: 1. No evidence of acute intracranial abnormality. ASPECTS of 10. 2. Mild-to-moderate chronic small vessel ischemic disease. 3. Interval chronic left cerebellar infarct. Electronically Signed   By: Constantine Delude.D.  On: 10/13/2023 18:01        Scheduled Meds:  aspirin   81 mg Oral Daily   clopidogrel  75 mg Oral Daily   ezetimibe  10 mg Oral Daily   ferrous sulfate  325 mg Oral Once per day on Monday Wednesday Friday   heparin  5,000 Units Subcutaneous Q8H   pantoprazole  40 mg Oral Daily   pneumococcal 20-valent conjugate vaccine  0.5 mL Intramuscular Tomorrow-1000   rosuvastatin   40 mg Oral Daily   Continuous Infusions:     LOS: 2 days    Time spent: over 30 min     Donnetta Gains, MD [facility]   To contact the attending provider between 7A-7P or the covering provider during after hours 7P-7A, please log into the web site [email or web address] and access using universal [place] password for that web site. If you do not have the password, please call the hospital operator.  10/15/2023, 10:00 AM

## 2023-10-15 NOTE — Progress Notes (Signed)
 Physical Therapy Treatment Patient Details Name: Destiny Ruiz MRN: [id number] DOB: 07/20/1930 Today's Date: 10/15/2023   History of Present Illness 89 yo female presents to [facility] 6/14 with acute onset R weakness and AMS. MRI shows 1 cm acute ischemic nonhemorrhagic left thalamic infarct. PMHx of HTN, HLD, neuropathy, stroke and obesity.    PT Comments  Patient progressing with mobility for sit to stand with a little improvement with practice over time.  Still mod A with some lifting help though pt able to use UE's from arms of chair for improved lift.  She remained in chair post session feeling improved to be up OOB.  Patient still appropriate for inpatient rehab (<3 hours/day) as not stable for home alone.    If plan is discharge home, recommend the following: A lot of help with walking and/or transfers;A lot of help with bathing/dressing/bathroom;Assistance with cooking/housework;Help with stairs or ramp for entrance;Assist for transportation   Can travel by private vehicle        Equipment Recommendations  None recommended by PT    Recommendations for Other Services       Precautions / Restrictions Precautions Precautions: Fall Recall of Precautions/Restrictions: Intact     Mobility  Bed Mobility               General bed mobility comments: in recliner    Transfers Overall transfer level: Needs assistance Equipment used: Rolling walker (2 wheels) Transfers: Sit to/from Stand Sit to Stand: Mod assist           General transfer comment: performed x 3 reps for technique and LE strength    Ambulation/Gait                   Stairs             Wheelchair Mobility     Tilt Bed    Modified Rankin (Stroke Patients Only)       Balance Overall balance assessment: Needs assistance   Sitting balance-Leahy Scale: Fair     Standing balance support: Bilateral upper extremity supported Standing balance-Leahy Scale: Poor                               Communication    Cognition Arousal: Alert Behavior During Therapy: WFL for tasks assessed/performed                             Following commands: Intact      Cueing Cueing Techniques: Verbal cues  Exercises General Exercises - Lower Extremity Short Arc Quad: AROM, Both, 10 reps, Seated, AAROM Long Arc Quad: AROM, 10 reps, Both, Seated Heel Slides: AAROM, 10 reps, Both, Seated Hip ABduction/ADduction: Strengthening, Both, 10 reps, Seated (hip adductor squeezes) Other Exercises Other Exercises: seated trunk flex ext arms crossed over chest x 5    General Comments        Pertinent Vitals/Pain Pain Assessment Pain Assessment: No/denies pain    Home Living                          Prior Function            PT Goals (current goals can now be found in the care plan section) Progress towards PT goals: Progressing toward goals    Frequency    Min 3X/week      PT Plan  Co-evaluation              AM-PAC PT 6 Clicks Mobility   Outcome Measure  Help needed turning from your back to your side while in a flat bed without using bedrails?: A Lot Help needed moving from lying on your back to sitting on the side of a flat bed without using bedrails?: A Lot Help needed moving to and from a bed to a chair (including a wheelchair)?: A Lot Help needed standing up from a chair using your arms (e.g., wheelchair or bedside chair)?: A Lot Help needed to walk in hospital room?: Total Help needed climbing 3-5 steps with a railing? : Total 6 Click Score: 10    End of Session Equipment Utilized During Treatment: Gait belt Activity Tolerance: Patient limited by fatigue Patient left: in chair;with call bell/phone within reach   PT Visit Diagnosis: Other abnormalities of gait and mobility (R26.89);Muscle weakness (generalized) (M62.81)     Time: 0865-7846 PT Time Calculation (min) (ACUTE ONLY): 29 min  Charges:     $Therapeutic Exercise: 8-22 mins $Therapeutic Activity: 8-22 mins PT General Charges $$ ACUTE PT VISIT: 1 Visit                     Abigail Hoff, PT Acute Rehabilitation Services Office:[phone] 10/15/2023    Destiny Ruiz 10/15/2023, 5:36 PM

## 2023-10-16 DIAGNOSIS — I739 Peripheral vascular disease, unspecified: Secondary | ICD-10-CM | POA: Diagnosis not present

## 2023-10-16 DIAGNOSIS — I69391 Dysphagia following cerebral infarction: Secondary | ICD-10-CM | POA: Diagnosis not present

## 2023-10-16 DIAGNOSIS — I6389 Other cerebral infarction: Secondary | ICD-10-CM | POA: Diagnosis not present

## 2023-10-16 DIAGNOSIS — R29705 NIHSS score 5: Secondary | ICD-10-CM | POA: Diagnosis not present

## 2023-10-16 DIAGNOSIS — I639 Cerebral infarction, unspecified: Secondary | ICD-10-CM | POA: Diagnosis not present

## 2023-10-16 MED ORDER — ROSUVASTATIN CALCIUM 40 MG PO TABS: 40.0000 mg | ORAL_TABLET | Freq: Every day | ORAL | Status: AC

## 2023-10-16 MED ORDER — ASPIRIN EC 81 MG PO TBEC: 81.0000 mg | DELAYED_RELEASE_TABLET | Freq: Every day | ORAL | 0 refills | Status: AC

## 2023-10-16 MED ORDER — PANTOPRAZOLE SODIUM 40 MG PO TBEC: 40.0000 mg | DELAYED_RELEASE_TABLET | Freq: Every day | ORAL | Status: AC

## 2023-10-16 MED ORDER — CLOPIDOGREL BISULFATE 75 MG PO TABS: 75.0000 mg | ORAL_TABLET | Freq: Every day | ORAL | Status: AC

## 2023-10-16 NOTE — Progress Notes (Signed)
 STROKE TEAM PROGRESS NOTE   INTERIM HISTORY/SUBJECTIVE Patient sitting up in bedside chair and appears comfortable.  No complaints. She is awaiting transfer for rehab to skilled nursing facility.  Patient and family decided not to participate in the sleep smart study.  Neurological exam is unchanged  CBC    Component Value Date/Time   WBC 6.1 10/15/2023 0438   RBC 4.20 10/15/2023 0438   HGB 11.1 (L) 10/15/2023 0438   HCT 36.3 10/15/2023 0438   PLT 173 10/15/2023 0438   MCV 86.4 10/15/2023 0438   MCH 26.4 10/15/2023 0438   MCHC 30.6 10/15/2023 0438   RDW 15.1 10/15/2023 0438   LYMPHSABS 1.4 10/15/2023 0438   MONOABS 0.6 10/15/2023 0438   EOSABS 0.3 10/15/2023 0438   BASOSABS 0.1 10/15/2023 0438    BMET    Component Value Date/Time   NA 141 10/15/2023 0438   K 4.3 10/15/2023 0438   CL 105 10/15/2023 0438   CO2 29 10/15/2023 0438   GLUCOSE 95 10/15/2023 0438   BUN 16 10/15/2023 0438   CREATININE 0.94 10/15/2023 0438   CALCIUM  8.8 (L) 10/15/2023 0438   GFRNONAA 58 (L) 10/15/2023 0438    IMAGING past 24 hours No results found.   Vitals:   10/15/23 1930 10/15/23 2342 10/16/23 0406 10/16/23 0729  BP: (!) 157/77 137/76 (!) 156/58 (!) 157/76  Pulse:  66 62 63  Resp:  20 18 16   Temp: 98.2 F (36.8 C)  99.6 F (37.6 C) 98.2 F (36.8 C)  TempSrc: Oral  Oral Oral  SpO2: 99% 99% 95% 97%  Weight:      Height:         PHYSICAL EXAM General:  Alert, well-nourished, well-developed patient in no acute distress Psych:  Mood and affect appropriate for situation CV: Regular rate and rhythm on monitor Respiratory:  Regular, unlabored respirations on room air GI: Abdomen soft and nontender   NEURO:  Mental Status: AA&Ox3, patient is able to give clear and coherent history Speech/Language: speech is without dysarthria or aphasia.  Naming, repetition, fluency, and comprehension intact.  Cranial Nerves:  II: PERRL. Visual fields full.  III, IV, VI: EOMI. Eyelids elevate  symmetrically.  V: Sensation is intact to light touch and symmetrical to face.  VII: Face is symmetrical resting and smiling VIII: hearing intact to voice. IX, X: Palate elevates symmetrically. Phonation is normal.  NW:GNFAOZHY shrug 5/5. XII: tongue is midline without fasciculations. Motor: 5/5 in right arm, left arm 3/5 (old), right lower 4-/5, left lower 3/5 Tone: is normal and bulk is normal Sensation- decreased on left (old) Coordination: FTN intact bilaterally, HKS: no ataxia in BLE.No drift.  Gait- deferred  Most Recent NIH   1a Level of Conscious.:  1b LOC Questions:  1c LOC Commands:  2 Best Gaze:  3 Visual:  4 Facial Palsy:  5a Motor Arm - left: 1 5b Motor Arm - Right: 0 6a Motor Leg - Left: 2 6b Motor Leg - Right: 1 7 Limb Ataxia:  8 Sensory: 1 9 Best Language:  10 Dysarthria:  11 Extinct. and Inatten.:  TOTAL: 5  Baseline Pre morbid  modified Rankin scale of 3  ASSESSMENT/PLAN  Ms. SIDNEY SILBERMAN is a 89 y.o. female with history of  DVT, HTN, HLD, GERD, gout, stroke and obesity who presents with acute onset altered mental status and right sided weakness. She was nearly nonverbal on arrival but did improve over the next 30 minutes, becoming oriented to place, time and  situation  NIH on Admission 13  Acute Ischemic Infarct:  left thalamic  Etiology:  small vessel disease   Code Stroke CT head No acute abnormality. Small vessel disease. ASPECTS 10.   chronic left cerebellar infarct.  MRI  left thalamic infarct.  atrophy with moderately advanced chronic microvascular ischemic disease, with small remote left cerebellar and right thalamic infarcts. MRA   NO LVO  Carotid Doppler no significant extracranial stenosis.   2D Echo ejection fraction 65 to 70%.  Mild left atrial dilatation.   LDL 87 HgbA1c 6.0 VTE prophylaxis - heparin SQ aspirin  81 mg daily prior to admission, now on aspirin  81 mg daily and clopidogrel 75 mg daily for 3 weeks and then plavix  alone. Therapy recommendations:  SNF Disposition:   SNF  Hx of Stroke/TIA  chronic left cerebellar infarct and right thalamic infarcts.  Hypertension Home meds:  amlodipine 10mg , IMDUR 60mg , losartan 100mg , atenolol 100mg   Stable Blood Pressure Goal: BP less than 220/110   Hyperlipidemia Home meds:  zetia 10mg ,  resumed in hospital LDL 87, goal < 70 Add crestor  40mg   Continue statin at discharge  Dysphagia Patient has post-stroke dysphagia, SLP consulted    Diet   Diet regular Fluid consistency: Thin   Advance diet as tolerated  Other Stroke Risk Factors Obesity, Body mass index is 45.41 kg/m., BMI >/= 30 associated with increased stroke risk, recommend weight loss, diet and exercise as appropriate    Other Active Problems GERD Neuropathy  Gout  Hospital day # 3    Patient appears to be neurologically improving.  Therapy is recommending skilled nursing facility.  Recommend Plavix for 3 weeks followed by Plavix alone and aggressive risk factor modification.  Transfer to skilled nursing facility when bed available.  Stroke team will sign off.  Kindly call for questions.   Ardella Beaver, MD Medical Director [facility] Pager: [phone] 10/16/2023 2:49 PM  10/16/2023 2:49 PM   To contact Stroke Continuity provider, please refer to [email or web address]. After hours, contact General Neurology

## 2023-10-16 NOTE — Care Management Important Message (Signed)
 Important Message  Patient Details  Name: Destiny Ruiz MRN: [id number] Date of Birth: 1930/05/24   Important Message Given:  Yes - Medicare IM     Wynonia Hedges 10/16/2023, 3:26 PM

## 2023-10-16 NOTE — Progress Notes (Signed)
 Physical Therapy Treatment Patient Details Name: Destiny Ruiz MRN: [id number] DOB: Aug 03, 1930 Today's Date: 10/16/2023   History of Present Illness 89 yo female presents to [facility] 6/14 with acute onset R weakness and AMS. MRI shows 1 cm acute ischemic nonhemorrhagic left thalamic infarct. PMHx of HTN, HLD, neuropathy, stroke and obesity.    PT Comments  Pt tolerated treatment well today. Initally started as a Mod A but progressed to a Min A. Cues for hand placement, safety, and sequencing. Gait training deferred due to bowel incontinence. No change in DC/DME recs at this time. Pt anticipates DC to SNF today.    If plan is discharge home, recommend the following: A lot of help with walking and/or transfers;A lot of help with bathing/dressing/bathroom;Assistance with cooking/housework;Help with stairs or ramp for entrance;Assist for transportation   Can travel by private vehicle     No  Equipment Recommendations  None recommended by PT    Recommendations for Other Services       Precautions / Restrictions Precautions Precautions: Fall Recall of Precautions/Restrictions: Intact Restrictions Weight Bearing Restrictions Per Provider Order: No     Mobility  Bed Mobility Overal bed mobility: Needs Assistance Bed Mobility: Supine to Sit     Supine to sit: Mod assist     General bed mobility comments: Mod A to scoot hips forward    Transfers Overall transfer level: Needs assistance Equipment used: Rolling walker (2 wheels) Transfers: Sit to/from Stand, Bed to chair/wheelchair/BSC Sit to Stand: Min assist, Mod assist   Step pivot transfers: Min assist, Mod assist       General transfer comment: Initally started as a Mod A but progressed to a Min A. Cues for hand placement, safety, and sequencing.    Ambulation/Gait               General Gait Details: deferred due to bowel incontinence   Stairs             Wheelchair Mobility     Tilt Bed     Modified Rankin (Stroke Patients Only) Modified Rankin (Stroke Patients Only) Pre-Morbid Rankin Score: No symptoms Modified Rankin: Moderate disability     Balance Overall balance assessment: Needs assistance Sitting-balance support: No upper extremity supported, Feet supported Sitting balance-Leahy Scale: Fair     Standing balance support: Bilateral upper extremity supported Standing balance-Leahy Scale: Poor                              Communication Communication Communication: No apparent difficulties  Cognition Arousal: Alert Behavior During Therapy: WFL for tasks assessed/performed   PT - Cognitive impairments: No apparent impairments                       PT - Cognition Comments: lacks insight into present deficits Following commands: Intact      Cueing Cueing Techniques: Verbal cues  Exercises      General Comments General comments (skin integrity, edema, etc.): VSS      Pertinent Vitals/Pain Pain Assessment Pain Assessment: No/denies pain    Home Living                          Prior Function            PT Goals (current goals can now be found in the care plan section) Progress towards PT goals: Progressing toward goals  Frequency    Min 3X/week      PT Plan      Co-evaluation              AM-PAC PT 6 Clicks Mobility   Outcome Measure  Help needed turning from your back to your side while in a flat bed without using bedrails?: A Lot Help needed moving from lying on your back to sitting on the side of a flat bed without using bedrails?: A Lot Help needed moving to and from a bed to a chair (including a wheelchair)?: A Lot Help needed standing up from a chair using your arms (e.g., wheelchair or bedside chair)?: A Lot Help needed to walk in hospital room?: Total Help needed climbing 3-5 steps with a railing? : Total 6 Click Score: 10    End of Session Equipment Utilized During Treatment:  Gait belt Activity Tolerance: Patient tolerated treatment well Patient left: in chair;with call bell/phone within reach;with nursing/sitter in room;with chair alarm set Nurse Communication: Mobility status PT Visit Diagnosis: Other abnormalities of gait and mobility (R26.89);Muscle weakness (generalized) (M62.81)     Time: 4098-1191 PT Time Calculation (min) (ACUTE ONLY): 29 min  Charges:    $Therapeutic Activity: 23-37 mins PT General Charges $$ ACUTE PT VISIT: 1 Visit                     Rodgers Clack, PT, DPT Acute Rehab Services [phone]    Destiny Ruiz 10/16/2023, 3:37 PM

## 2023-10-16 NOTE — Discharge Summary (Signed)
 Physician Discharge Summary  Destiny Ruiz BMW:[id number] DOB: 04/20/1931 DOA: 10/13/2023  PCP: Comer Decamp, MD  Admit date: 10/13/2023 Discharge date: 10/16/2023  Time spent: 40 minutes  Recommendations for Outpatient Follow-up:  Follow outpatient CBC/CMP  Follow with neurology outpatient  Continue DAPT x21 days, then plavix alone Follow symptoms on crestor , had cramping on lipitor Follow blood pressure outpatient - atenolol has been discontinued due to bradycardia   Discharge Diagnoses:  Principal Problem:   Acute CVA (cerebrovascular accident) [facility])   Discharge Condition: stable  Diet recommendation: heart healthy  Filed Weights   10/13/23 1700 10/13/23 2017  Weight: 120 kg 120 kg    History of present illness:   Destiny Ruiz is Destiny Ruiz 89 y.o. female with medical history significant of DVT, GERD, Gout, Essential Hypertension, Neuropathy ,CVA,who presents to ED with complaint of acute onset of right sided facial droop, arm weakness and leg weakness, headache as well as confusion. Patient  LSW was 0730 this am and ambulance was called at 1630. Patient notes does not feel well, currently complaining of HA. She notes she still feel weak and she noted both arms feel weak. She notes no n/v/d /f/c/or abdominal pain. She notes no new weakness in her legs.   MRI showed 1 cm acute ischemic nonhemorrhagic L thalamic infarct.  Neurology recommending DAPTx3 weeks followed by plavix.  Crestor .    Therapy recommending SNF.  Stable for discharge to [facility] 6/17.  See below for additional details.   Hospital Course:  Assessment and Plan:  Acute Stroke -MRI with 1 cm acute ischemic nonhemorrhagic L thalamic infarct -MRA without LVO or other emergent findings.  No hemodynamically significant or correctable stenosis.  Negative MRA of neck.  -A1c 6/LDL 87 -neurology suspects due to small vessel disease, recommending DAPTx3 weeks followed by plavix.  Crestor  40 mg.  - echo with EF 65-70%,  no RWMA, grade II diastolic dysfunction (see report) - carotid US  with near normal carotids bilaterally - PT/OT/SLP recommending SNF   HLD -continue with statin and zetia    GERD -ppi   Gout -continue allopurinol     Essential Hypertension Will gradually resume home meds - amlodipine.  Resume imdur and cozaar and follow outpatient.  Stop atenolol (HR on slow side)   Neuropathy ,nos  - continue gabapentin   DVT -s/p tx    Procedures: Carotid US  Summary:  Right Carotid: The extracranial vessels were near-normal with only minimal  wall                thickening or plaque.   Left Carotid: The extracranial vessels were near-normal with only minimal  wall               thickening or plaque.   Vertebrals:  Bilateral vertebral arteries demonstrate antegrade flow.  Subclavians: Normal flow hemodynamics were seen in bilateral subclavian               arteries.   Echo IMPRESSIONS     1. Left ventricular ejection fraction, by estimation, is 65 to 70%. The  left ventricle has normal function. The left ventricle has no regional  wall motion abnormalities. There is mild left ventricular hypertrophy.  Left ventricular diastolic parameters  are consistent with Grade II diastolic dysfunction (pseudonormalization).  Elevated left atrial pressure. The average left ventricular global  longitudinal strain is -22.0 %. The global longitudinal strain is normal.   2. Right ventricular systolic function is normal. The right ventricular  size is mildly enlarged.  Tricuspid regurgitation signal is inadequate for  assessing PA pressure.   3. Left atrial size was mildly dilated.   4. The mitral valve is normal in structure. Trivial mitral valve  regurgitation. No evidence of mitral stenosis.   5. The aortic valve is tricuspid. Aortic valve regurgitation is not  visualized. Aortic valve sclerosis is present, with no evidence of aortic  valve stenosis.   6. The inferior vena cava is normal  in size with greater than 50%  respiratory variability, suggesting right atrial pressure of 3 mmHg.   Consultations: neurology  Discharge Exam: Vitals:   10/16/23 0406 10/16/23 0729  BP: (!) 156/58 (!) 157/76  Pulse: 62 63  Resp: 18 16  Temp: 99.6 F (37.6 C) 98.2 F (36.8 C)  SpO2: 95% 97%   No complaints  General: No acute distress. Cardiovascular: RRR Lungs: unlabored Abdomen: Soft, nontender, nondistended  Neurological: Alert and oriented 3. L sided weakness. Cranial nerves II through XII intact. Extremities: No clubbing or cyanosis. No edema.   Discharge Instructions   Discharge Instructions     Ambulatory referral to Neurology   Complete by: As directed    An appointment is requested in approximately: 4 weeks   Call MD for:  difficulty breathing, headache or visual disturbances   Complete by: As directed    Call MD for:  extreme fatigue   Complete by: As directed    Call MD for:  hives   Complete by: As directed    Call MD for:  persistant dizziness or light-headedness   Complete by: As directed    Call MD for:  persistant nausea and vomiting   Complete by: As directed    Call MD for:  redness, tenderness, or signs of infection (pain, swelling, redness, odor or green/yellow discharge around incision site)   Complete by: As directed    Call MD for:  severe uncontrolled pain   Complete by: As directed    Call MD for:  temperature >100.4   Complete by: As directed    Diet - low sodium heart healthy   Complete by: As directed    Discharge instructions   Complete by: As directed    You were seen for Tinia Oravec stroke.  You've been started on aspirin  and plavix.  You'll take these together for 21 days, then take plavix alone.  We've started you on crestor .  We'll stop your atenolol.  Continue your other blood pressure medicines.   Return for new, recurrent, or worsening symptoms.  Please ask your PCP to request records from this hospitalization so they know what  was done and what the next steps will be.   Increase activity slowly   Complete by: As directed       Allergies as of 10/16/2023       Reactions   Ace Inhibitors Other (See Comments)   Cramping   Lipitor [atorvastatin] Other (See Comments)   Cramping        Medication List     STOP taking these medications    atenolol 100 MG tablet Commonly known as: TENORMIN   omeprazole 20 MG capsule Commonly known as: PRILOSEC Replaced by: pantoprazole 40 MG tablet       TAKE these medications    amLODipine 10 MG tablet Commonly known as: NORVASC Take 10 mg by mouth daily.   aspirin  EC 81 MG tablet Take 1 tablet (81 mg total) by mouth daily for 17 days.   clopidogrel 75 MG tablet Commonly known  as: PLAVIX Take 1 tablet (75 mg total) by mouth daily. Start taking on: October 17, 2023   ezetimibe 10 MG tablet Commonly known as: ZETIA Take 10 mg by mouth daily.   FeroSul 325 (65 Fe) MG tablet Generic drug: ferrous sulfate Take 325 mg by mouth 3 (three) times Jhonny Calixto week.   isosorbide mononitrate 60 MG 24 hr tablet Commonly known as: IMDUR Take 60 mg by mouth daily.   losartan 100 MG tablet Commonly known as: COZAAR Take 100 mg by mouth daily.   pantoprazole 40 MG tablet Commonly known as: PROTONIX Take 1 tablet (40 mg total) by mouth daily. Start taking on: October 17, 2023 Replaces: omeprazole 20 MG capsule   rosuvastatin  40 MG tablet Commonly known as: CRESTOR  Take 1 tablet (40 mg total) by mouth daily. Start taking on: October 17, 2023       Allergies  Allergen Reactions   Ace Inhibitors Other (See Comments)    Cramping    Lipitor [Atorvastatin] Other (See Comments)    Cramping     Contact information for follow-up providers     [facility] Follow up.   Contact information: [address]     [address] [place]  [address] [phone]             Contact information for after-discharge care     Destination      [facility] .   Service: Skilled Nursing Contact information: [address] [place]  [phone]                      The results of significant diagnostics from this hospitalization (including imaging, microbiology, ancillary and laboratory) are listed below for reference.    Significant Diagnostic Studies: VAS US  CAROTID Result Date: 10/15/2023 Carotid Arterial Duplex Study Patient Name:  AIDELIZ GARMANY  Date of Exam:   10/14/2023 Medical Rec #: [id number]          Accession #:    [id number] Date of Birth: 12-18-1930          Patient Gender: F Patient Age:   25 years Exam Location:  [facility] Procedure:      VAS US  CAROTID Referring Phys: Margart Shears DE LA TORRE --------------------------------------------------------------------------------  Indications:       CVA, Weakness and Altered mental status. Risk Factors:      Hypertension, hyperlipidemia. Limitations        Today's exam was limited due to the high bifurcation of the                    carotid, the patient's respiratory variation and the body                    habitus of the patient. Comparison Study:  No prior study on file Performing Technologist: Carleene Chase RVS  Examination Guidelines: Santresa Levett complete evaluation includes B-mode imaging, spectral Doppler, color Doppler, and power Doppler as needed of all accessible portions of each vessel. Bilateral testing is considered an integral part of Zyana Amaro complete examination. Limited examinations for reoccurring indications may be performed as noted.  Right Carotid Findings: +----------+--------+--------+--------+------------------+--------+           PSV cm/sEDV cm/sStenosisPlaque DescriptionComments +----------+--------+--------+--------+------------------+--------+ CCA Prox  98      21                                         +----------+--------+--------+--------+------------------+--------+  CCA Distal101     13                                          +----------+--------+--------+--------+------------------+--------+ ICA Prox  74      15              calcific                   +----------+--------+--------+--------+------------------+--------+ ICA Mid   81      13                                         +----------+--------+--------+--------+------------------+--------+ ICA Distal72      15                                tortuous +----------+--------+--------+--------+------------------+--------+ ECA       94      5                                          +----------+--------+--------+--------+------------------+--------+ +----------+--------+-------+--------+-------------------+           PSV cm/sEDV cmsDescribeArm Pressure (mmHG) +----------+--------+-------+--------+-------------------+ [id number]                                        +----------+--------+-------+--------+-------------------+ +---------+--------+--+--------+--+ VertebralPSV cm/s92EDV cm/s17 +---------+--------+--+--------+--+  Left Carotid Findings: +----------+--------+--------+--------+------------------+--------+           PSV cm/sEDV cm/sStenosisPlaque DescriptionComments +----------+--------+--------+--------+------------------+--------+ CCA Prox  67      15                                         +----------+--------+--------+--------+------------------+--------+ CCA Distal58      12                                         +----------+--------+--------+--------+------------------+--------+ ICA Prox  107     13                                tortuous +----------+--------+--------+--------+------------------+--------+ ICA Mid   88      23                                tortuous +----------+--------+--------+--------+------------------+--------+ ICA Distal59      10                                tortuous  +----------+--------+--------+--------+------------------+--------+ ECA       111     2                                 tortuous +----------+--------+--------+--------+------------------+--------+ +----------+--------+--------+--------+-------------------+  PSV cm/sEDV cm/sDescribeArm Pressure (mmHG) +----------+--------+--------+--------+-------------------+ [id number]                                          +----------+--------+--------+--------+-------------------+ +---------+--------+---+--------+--+ VertebralPSV cm/s111EDV cm/s19 +---------+--------+---+--------+--+   Summary: Right Carotid: The extracranial vessels were near-normal with only minimal wall                thickening or plaque. Left Carotid: The extracranial vessels were near-normal with only minimal wall               thickening or plaque. Vertebrals:  Bilateral vertebral arteries demonstrate antegrade flow. Subclavians: Normal flow hemodynamics were seen in bilateral subclavian              arteries. *See table(s) above for measurements and observations.  Electronically signed by Ardella Beaver MD on 10/15/2023 at 7:46:07 AM.    Final    ECHOCARDIOGRAM COMPLETE Result Date: 10/14/2023    ECHOCARDIOGRAM REPORT   Patient Name:   SAMEERA BETTON Date of Exam: 10/14/2023 Medical Rec #:  [id number]         Height:       64.0 in Accession #:    [id number]        Weight:       264.6 lb Date of Birth:  1930/06/07         BSA:          2.203 m Patient Age:    89 years          BP:           126/93 mmHg Patient Gender: F                 HR:           53 bpm. Exam Location:  Inpatient Procedure: 2D Echo, 3D Echo, Cardiac Doppler, Color Doppler, Strain Analysis and            Intracardiac Opacification Agent (Both Spectral and Color Flow            Doppler were utilized during procedure). Indications:    Stroke  History:        Patient has prior history of Echocardiogram examinations, most                 recent  04/12/2018. DVT; Risk Factors:Hypertension and                 Dyslipidemia. GERD.  Sonographer:    Travis Friedman RDCS Referring Phys: [id number] CORTNEY E DE LA TORRE IMPRESSIONS  1. Left ventricular ejection fraction, by estimation, is 65 to 70%. The left ventricle has normal function. The left ventricle has no regional wall motion abnormalities. There is mild left ventricular hypertrophy. Left ventricular diastolic parameters are consistent with Grade II diastolic dysfunction (pseudonormalization). Elevated left atrial pressure. The average left ventricular global longitudinal strain is -22.0 %. The global longitudinal strain is normal.  2. Right ventricular systolic function is normal. The right ventricular size is mildly enlarged. Tricuspid regurgitation signal is inadequate for assessing PA pressure.  3. Left atrial size was mildly dilated.  4. The mitral valve is normal in structure. Trivial mitral valve regurgitation. No evidence of mitral stenosis.  5. The aortic valve is tricuspid. Aortic valve regurgitation is not visualized. Aortic valve sclerosis is present, with no evidence of aortic valve stenosis.  6. The inferior vena  cava is normal in size with greater than 50% respiratory variability, suggesting right atrial pressure of 3 mmHg. FINDINGS  Left Ventricle: Left ventricular ejection fraction, by estimation, is 65 to 70%. The left ventricle has normal function. The left ventricle has no regional wall motion abnormalities. The average left ventricular global longitudinal strain is -22.0 %. Strain was performed and the global longitudinal strain is normal. 3D ejection fraction reviewed and evaluated as part of the interpretation. Alternate measurement of EF is felt to be most reflective of LV function. The left ventricular internal cavity size was normal in size. There is mild left ventricular hypertrophy. Left ventricular diastolic parameters are consistent with Grade II diastolic dysfunction  (pseudonormalization). Elevated left atrial pressure. Right Ventricle: The right ventricular size is mildly enlarged. No increase in right ventricular wall thickness. Right ventricular systolic function is normal. Tricuspid regurgitation signal is inadequate for assessing PA pressure. Left Atrium: Left atrial size was mildly dilated. Right Atrium: Right atrial size was normal in size. Pericardium: Trivial pericardial effusion is present. Mitral Valve: The mitral valve is normal in structure. Trivial mitral valve regurgitation. No evidence of mitral valve stenosis. Tricuspid Valve: The tricuspid valve is normal in structure. Tricuspid valve regurgitation is trivial. Aortic Valve: The aortic valve is tricuspid. Aortic valve regurgitation is not visualized. Aortic valve sclerosis is present, with no evidence of aortic valve stenosis. Aortic valve mean gradient measures 5.0 mmHg. Aortic valve peak gradient measures 9.9  mmHg. Aortic valve area, by VTI measures 2.70 cm. Pulmonic Valve: The pulmonic valve was not well visualized. Pulmonic valve regurgitation is not visualized. Aorta: The aortic root and ascending aorta are structurally normal, with no evidence of dilitation. Venous: The inferior vena cava is normal in size with greater than 50% respiratory variability, suggesting right atrial pressure of 3 mmHg. IAS/Shunts: The interatrial septum was not well visualized. Additional Comments: 3D was performed not requiring image post processing on an independent workstation and was normal.  LEFT VENTRICLE PLAX 2D LVIDd:         4.50 cm      Diastology LVIDs:         2.10 cm      LV e' medial:    6.85 cm/s LV PW:         1.00 cm      LV E/e' medial:  13.8 LV IVS:        0.90 cm      LV e' lateral:   6.85 cm/s LVOT diam:     2.30 cm      LV E/e' lateral: 13.8 LV SV:         113 LV SV Index:   51           2D Longitudinal Strain LVOT Area:     4.15 cm     2D Strain GLS Avg:     -22.0 %  LV Volumes (MOD) LV vol d, MOD A2C:  123.0 ml 3D Volume EF: LV vol d, MOD A4C: 161.0 ml 3D EF:        64 % LV vol s, MOD A2C: 37.4 ml  LV EDV:       116 ml LV vol s, MOD A4C: 49.4 ml  LV ESV:       42 ml LV SV MOD A2C:     85.6 ml  LV SV:        74 ml LV SV MOD A4C:     161.0 ml LV SV MOD  BP:      107.7 ml RIGHT VENTRICLE             IVC RV Basal diam:  4.20 cm     IVC diam: 1.60 cm RV Mid diam:    2.10 cm RV S prime:     14.50 cm/s TAPSE (M-mode): 3.1 cm LEFT ATRIUM             Index        RIGHT ATRIUM           Index LA diam:        3.80 cm 1.72 cm/m   RA Area:     15.80 cm LA Vol (A2C):   89.2 ml 40.48 ml/m  RA Volume:   36.60 ml  16.61 ml/m LA Vol (A4C):   61.7 ml 28.00 ml/m LA Biplane Vol: 79.5 ml 36.08 ml/m  AORTIC VALVE AV Area (Vmax):    2.86 cm AV Area (Vmean):   2.76 cm AV Area (VTI):     2.70 cm AV Vmax:           157.00 cm/s AV Vmean:          105.000 cm/s AV VTI:            0.417 m AV Peak Grad:      9.9 mmHg AV Mean Grad:      5.0 mmHg LVOT Vmax:         108.00 cm/s LVOT Vmean:        69.700 cm/s LVOT VTI:          0.271 m LVOT/AV VTI ratio: 0.65  AORTA Ao Root diam: 2.80 cm Ao Asc diam:  3.40 cm MITRAL VALVE MV Area (PHT): 3.08 cm     SHUNTS MV Decel Time: 246 msec     Systemic VTI:  0.27 m MV E velocity: 94.20 cm/s   Systemic Diam: 2.30 cm MV Haely Leyland velocity: 101.00 cm/s MV E/Janise Gora ratio:  0.93 Carson Clara MD Electronically signed by Carson Clara MD Signature Date/Time: 10/14/2023/12:58:49 PM    Final    DG CHEST PORT 1 VIEW Result Date: 10/13/2023 CLINICAL DATA:  Cerebrovascular accident EXAM: PORTABLE CHEST 1 VIEW COMPARISON:  04/11/2018 FINDINGS: Low lung volumes accentuate cardiac silhouette and pulmonary vascularity. Question basilar infiltrates. No pleural effusion or pneumothorax. No displaced rib fractures. IMPRESSION: Low lung volumes with basilar infiltrates versus atelectasis. Electronically Signed   By: Rozell Cornet M.D.   On: 10/13/2023 23:25   MR BRAIN WO CONTRAST Result Date: 10/13/2023 CLINICAL  DATA:  Initial evaluation for acute neuro deficit, stroke suspected. EXAM: MRI HEAD WITHOUT CONTRAST MRA HEAD WITHOUT CONTRAST MRA NECK WITHOUT CONTRAST TECHNIQUE: Multiplanar, multiecho pulse sequences of the brain and surrounding structures were obtained without intravenous contrast. Angiographic images of the Circle of Willis were obtained using MRA technique without intravenous contrast. Angiographic images of the neck were obtained using MRA technique without intravenous contrast. Carotid stenosis measurements (when applicable) are obtained utilizing NASCET criteria, using the distal internal carotid diameter as the denominator. COMPARISON:  Comparison made with prior CT from earlier the same day. FINDINGS: MRI HEAD FINDINGS Brain: Generalized age-related cerebral atrophy. Patchy T2/FLAIR hyperintensity involving the periventricular deep white matter both cerebral hemispheres, consistent chronic small vessel ischemic disease, moderately advanced in nature. Small remote left cerebellar infarct noted. Chronic lacunar infarct at the right thalamus. 1 cm acute ischemic nonhemorrhagic infarcts seen involving the medial left thalamus. No significant mass effect. No other acute or subacute  ischemia. No acute intracranial hemorrhage. Few punctate chronic micro hemorrhages noted, likely small vessel related. No mass lesion, midline shift or mass effect. No hydrocephalus or extra-axial fluid collection. Pituitary gland within normal limits. Vascular: Major intracranial vascular flow voids are maintained. Skull and upper cervical spine: Craniocervical junction within normal limits. Bone marrow signal intensity within normal limits. No scalp soft tissue abnormality. Sinuses/Orbits: Prior bilateral ocular lens replacement. Paranasal sinuses are largely clear. Small right mastoid effusion noted, of doubtful significance. Other: None. MRA HEAD FINDINGS ANTERIOR CIRCULATION: Examination mildly degraded by motion artifact. Both  internal carotid arteries are patent through the siphons without stenosis or other abnormality. A1 segments patent bilaterally. Normal anterior communicating artery complex. Anterior cerebral arteries patent without stenosis. No M1 stenosis or occlusion. MCA branches perfused and fairly symmetric. POSTERIOR CIRCULATION: Vertebral arteries are largely code dominant and patent without stenosis. Right PICA patent. Left PICA origin not seen. Basilar patent without stenosis. Superior cerebellar and posterior cerebral arteries patent bilaterally. No intracranial aneurysm. MRA NECK FINDINGS AORTIC ARCH: Examination technically limited by motion and lack of IV contrast. Aortic arch and origin the great vessels not well assessed on this exam. RIGHT CAROTID SYSTEM: Visualized right common and internal carotid arteries are patent with antegrade flow. No visible dissection. No hemodynamically significant stenosis about the right carotid artery system. LEFT CAROTID SYSTEM: Visualized left common and internal carotid arteries are patent with antegrade flow. No visible dissection. No hemodynamically significant stenosis about the left carotid artery system. VERTEBRAL ARTERIES: Both vertebral arteries appear to arise from the subclavian arteries. Vertebral arteries are patent with antegrade flow. No visible dissection or stenosis. Other: None. IMPRESSION: MRI HEAD: 1. 1 cm acute ischemic nonhemorrhagic left thalamic infarct. 2. Underlying age-related cerebral atrophy with moderately advanced chronic microvascular ischemic disease, with small remote left cerebellar and right thalamic infarcts. MRA HEAD: Negative intracranial MRA for large vessel occlusion or other emergent finding. No hemodynamically significant or correctable stenosis. MRA NECK: Negative MRA of the neck. No hemodynamically significant stenosis or other acute vascular abnormality. Electronically Signed   By: Virgia Griffins M.D.   On: 10/13/2023 19:43   MR  ANGIO HEAD WO CONTRAST Result Date: 10/13/2023 CLINICAL DATA:  Initial evaluation for acute neuro deficit, stroke suspected. EXAM: MRI HEAD WITHOUT CONTRAST MRA HEAD WITHOUT CONTRAST MRA NECK WITHOUT CONTRAST TECHNIQUE: Multiplanar, multiecho pulse sequences of the brain and surrounding structures were obtained without intravenous contrast. Angiographic images of the Circle of Willis were obtained using MRA technique without intravenous contrast. Angiographic images of the neck were obtained using MRA technique without intravenous contrast. Carotid stenosis measurements (when applicable) are obtained utilizing NASCET criteria, using the distal internal carotid diameter as the denominator. COMPARISON:  Comparison made with prior CT from earlier the same day. FINDINGS: MRI HEAD FINDINGS Brain: Generalized age-related cerebral atrophy. Patchy T2/FLAIR hyperintensity involving the periventricular deep white matter both cerebral hemispheres, consistent chronic small vessel ischemic disease, moderately advanced in nature. Small remote left cerebellar infarct noted. Chronic lacunar infarct at the right thalamus. 1 cm acute ischemic nonhemorrhagic infarcts seen involving the medial left thalamus. No significant mass effect. No other acute or subacute ischemia. No acute intracranial hemorrhage. Few punctate chronic micro hemorrhages noted, likely small vessel related. No mass lesion, midline shift or mass effect. No hydrocephalus or extra-axial fluid collection. Pituitary gland within normal limits. Vascular: Major intracranial vascular flow voids are maintained. Skull and upper cervical spine: Craniocervical junction within normal limits. Bone marrow signal intensity within normal limits. No scalp  soft tissue abnormality. Sinuses/Orbits: Prior bilateral ocular lens replacement. Paranasal sinuses are largely clear. Small right mastoid effusion noted, of doubtful significance. Other: None. MRA HEAD FINDINGS ANTERIOR  CIRCULATION: Examination mildly degraded by motion artifact. Both internal carotid arteries are patent through the siphons without stenosis or other abnormality. A1 segments patent bilaterally. Normal anterior communicating artery complex. Anterior cerebral arteries patent without stenosis. No M1 stenosis or occlusion. MCA branches perfused and fairly symmetric. POSTERIOR CIRCULATION: Vertebral arteries are largely code dominant and patent without stenosis. Right PICA patent. Left PICA origin not seen. Basilar patent without stenosis. Superior cerebellar and posterior cerebral arteries patent bilaterally. No intracranial aneurysm. MRA NECK FINDINGS AORTIC ARCH: Examination technically limited by motion and lack of IV contrast. Aortic arch and origin the great vessels not well assessed on this exam. RIGHT CAROTID SYSTEM: Visualized right common and internal carotid arteries are patent with antegrade flow. No visible dissection. No hemodynamically significant stenosis about the right carotid artery system. LEFT CAROTID SYSTEM: Visualized left common and internal carotid arteries are patent with antegrade flow. No visible dissection. No hemodynamically significant stenosis about the left carotid artery system. VERTEBRAL ARTERIES: Both vertebral arteries appear to arise from the subclavian arteries. Vertebral arteries are patent with antegrade flow. No visible dissection or stenosis. Other: None. IMPRESSION: MRI HEAD: 1. 1 cm acute ischemic nonhemorrhagic left thalamic infarct. 2. Underlying age-related cerebral atrophy with moderately advanced chronic microvascular ischemic disease, with small remote left cerebellar and right thalamic infarcts. MRA HEAD: Negative intracranial MRA for large vessel occlusion or other emergent finding. No hemodynamically significant or correctable stenosis. MRA NECK: Negative MRA of the neck. No hemodynamically significant stenosis or other acute vascular abnormality. Electronically  Signed   By: Virgia Griffins M.D.   On: 10/13/2023 19:43   MR ANGIO NECK WO CONTRAST Result Date: 10/13/2023 CLINICAL DATA:  Initial evaluation for acute neuro deficit, stroke suspected. EXAM: MRI HEAD WITHOUT CONTRAST MRA HEAD WITHOUT CONTRAST MRA NECK WITHOUT CONTRAST TECHNIQUE: Multiplanar, multiecho pulse sequences of the brain and surrounding structures were obtained without intravenous contrast. Angiographic images of the Circle of Willis were obtained using MRA technique without intravenous contrast. Angiographic images of the neck were obtained using MRA technique without intravenous contrast. Carotid stenosis measurements (when applicable) are obtained utilizing NASCET criteria, using the distal internal carotid diameter as the denominator. COMPARISON:  Comparison made with prior CT from earlier the same day. FINDINGS: MRI HEAD FINDINGS Brain: Generalized age-related cerebral atrophy. Patchy T2/FLAIR hyperintensity involving the periventricular deep white matter both cerebral hemispheres, consistent chronic small vessel ischemic disease, moderately advanced in nature. Small remote left cerebellar infarct noted. Chronic lacunar infarct at the right thalamus. 1 cm acute ischemic nonhemorrhagic infarcts seen involving the medial left thalamus. No significant mass effect. No other acute or subacute ischemia. No acute intracranial hemorrhage. Few punctate chronic micro hemorrhages noted, likely small vessel related. No mass lesion, midline shift or mass effect. No hydrocephalus or extra-axial fluid collection. Pituitary gland within normal limits. Vascular: Major intracranial vascular flow voids are maintained. Skull and upper cervical spine: Craniocervical junction within normal limits. Bone marrow signal intensity within normal limits. No scalp soft tissue abnormality. Sinuses/Orbits: Prior bilateral ocular lens replacement. Paranasal sinuses are largely clear. Small right mastoid effusion noted, of  doubtful significance. Other: None. MRA HEAD FINDINGS ANTERIOR CIRCULATION: Examination mildly degraded by motion artifact. Both internal carotid arteries are patent through the siphons without stenosis or other abnormality. A1 segments patent bilaterally. Normal anterior communicating artery complex. Anterior cerebral  arteries patent without stenosis. No M1 stenosis or occlusion. MCA branches perfused and fairly symmetric. POSTERIOR CIRCULATION: Vertebral arteries are largely code dominant and patent without stenosis. Right PICA patent. Left PICA origin not seen. Basilar patent without stenosis. Superior cerebellar and posterior cerebral arteries patent bilaterally. No intracranial aneurysm. MRA NECK FINDINGS AORTIC ARCH: Examination technically limited by motion and lack of IV contrast. Aortic arch and origin the great vessels not well assessed on this exam. RIGHT CAROTID SYSTEM: Visualized right common and internal carotid arteries are patent with antegrade flow. No visible dissection. No hemodynamically significant stenosis about the right carotid artery system. LEFT CAROTID SYSTEM: Visualized left common and internal carotid arteries are patent with antegrade flow. No visible dissection. No hemodynamically significant stenosis about the left carotid artery system. VERTEBRAL ARTERIES: Both vertebral arteries appear to arise from the subclavian arteries. Vertebral arteries are patent with antegrade flow. No visible dissection or stenosis. Other: None. IMPRESSION: MRI HEAD: 1. 1 cm acute ischemic nonhemorrhagic left thalamic infarct. 2. Underlying age-related cerebral atrophy with moderately advanced chronic microvascular ischemic disease, with small remote left cerebellar and right thalamic infarcts. MRA HEAD: Negative intracranial MRA for large vessel occlusion or other emergent finding. No hemodynamically significant or correctable stenosis. MRA NECK: Negative MRA of the neck. No hemodynamically significant  stenosis or other acute vascular abnormality. Electronically Signed   By: Virgia Griffins M.D.   On: 10/13/2023 19:43   CT HEAD CODE STROKE WO CONTRAST Result Date: 10/13/2023 CLINICAL DATA:  Code stroke. Neuro deficit, acute, stroke suspected. Right-sided weakness, slurred speech, and altered mental status. EXAM: CT HEAD WITHOUT CONTRAST TECHNIQUE: Contiguous axial images were obtained from the base of the skull through the vertex without intravenous contrast. RADIATION DOSE REDUCTION: This exam was performed according to the departmental dose-optimization program which includes automated exposure control, adjustment of the mA and/or kV according to patient size and/or use of iterative reconstruction technique. COMPARISON:  Head MRI 12/13/2022 FINDINGS: Brain: There is no evidence of an acute infarct, intracranial hemorrhage, mass, midline shift, or extra-axial fluid collection. Mild cerebral atrophy is within limits for age. Patchy cerebral white matter hypodensities are nonspecific but compatible with mild-to-moderate chronic small vessel ischemic disease. Demita Tobia small chronic left cerebellar infarct is new. Vascular: No hyperdense vessel. Skull: No acute fracture or suspicious lesion. Sinuses/Orbits: Mild mucosal thickening/small mucous retention cyst inferiorly in the right maxillary sinus. Chronic left mastoid effusion. Bilateral cataract extraction. Other: None. ASPECTS ([place] Stroke Program Early CT Score) - Ganglionic level infarction (caudate, lentiform nuclei, internal capsule, insula, M1-M3 cortex): 7 - Supraganglionic infarction (M4-M6 cortex): 3 Total score (0-10 with 10 being normal): 10 These results were communicated to Dr. Lindzen at 6:01 pm on 10/13/2023 by text page via the [facility] messaging system. IMPRESSION: 1. No evidence of acute intracranial abnormality. ASPECTS of 10. 2. Mild-to-moderate chronic small vessel ischemic disease. 3. Interval chronic left cerebellar infarct. Electronically  Signed   By: Aundra Lee M.D.   On: 10/13/2023 18:01    Microbiology: No results found for this or any previous visit (from the past 240 hours).   Labs: Basic Metabolic Panel: Recent Labs  Lab 10/13/23 2159 10/13/23 2221 10/14/23 0100 10/14/23 1520 10/15/23 0438  NA 143 142  --  139 141  K 4.3 4.3  --  4.5 4.3  CL 103 106  --  105 105  CO2  --  26  --  25 29  GLUCOSE 87 94  --  110* 95  BUN 15 13  --  14 16  CREATININE 0.80 0.78 0.81 1.05* 0.94  CALCIUM   --  9.0  --  8.9 8.8*  MG  --   --   --   --  1.9  PHOS  --   --   --   --  4.3   Liver Function Tests: Recent Labs  Lab 10/13/23 2221 10/15/23 0438  AST 16 17  ALT 10 9  ALKPHOS 41 32*  BILITOT 0.7 0.8  PROT 6.9 6.7  ALBUMIN 3.3* 2.8*   No results for input(s): LIPASE, AMYLASE in the last 168 hours. No results for input(s): AMMONIA in the last 168 hours. CBC: Recent Labs  Lab 10/13/23 2159 10/13/23 2221 10/14/23 0100 10/14/23 1520 10/15/23 0438  WBC  --  6.7 6.3 6.6 6.1  NEUTROABS  --  4.3  --   --  3.6  HGB 13.3 11.7* 11.4* 11.4* 11.1*  HCT 39.0 39.1 37.0 36.7 36.3  MCV  --  87.7 88.3 86.6 86.4  PLT  --  191 205 173 173   Cardiac Enzymes: No results for input(s): CKTOTAL, CKMB, CKMBINDEX, TROPONINI in the last 168 hours. BNP: BNP (last 3 results) No results for input(s): BNP in the last 8760 hours.  ProBNP (last 3 results) No results for input(s): PROBNP in the last 8760 hours.  CBG: Recent Labs  Lab 10/13/23 1744  GLUCAP 88       Signed:  Donnetta Gains MD.  [facility] 10/16/2023, 2:09 PM

## 2023-10-16 NOTE — TOC Transition Note (Signed)
 Transition of Care [facility]) - Discharge Note   Patient Details  Name: Destiny Ruiz MRN: [id number] Date of Birth: 02-22-1931  [facility] of [facility]) CM/SW Contact:  Tandy Fam, LCSW Phone Number: 10/16/2023, 3:29 PM   Clinical Narrative:   CSW updated by MD that patient is stable for discharge today. CSW confirmed with [facility] that bed is available. CSW met with patient and spoke with daughters Kirstie Percy and Burdette Carolin, all in agreement. Transport arranged with [facility] for next available.  Nurse to call report to [phone], Room 24B.    Final next level of care: Skilled Nursing Facility Barriers to Discharge: Barriers Resolved   Patient Goals and CMS Choice Patient states their goals for this hospitalization and ongoing recovery are:: to get rehab CMS Medicare.gov Compare Post Acute Care list provided to:: Patient Represenative (must comment) Choice offered to / list presented to : Adult Children [place] ownership interest in [facility].provided to:: Adult Children    Discharge Placement              Patient [facility] bed at: [facility] Patient to be transferred to facility by: [facility] Name of family member notified: Margherita Shell Patient and family notified of of transfer: 10/16/23  Discharge Plan and Services Additional resources added to the After Visit Summary for       Post Acute Care Choice: Skilled Nursing Facility                               Social Drivers of Health (SDOH) Interventions SDOH Screenings   Food Insecurity: No Food Insecurity (10/14/2023)  Housing: Low Risk  (10/14/2023)  Transportation Needs: No Transportation Needs (10/14/2023)  Utilities: Not At Risk (10/14/2023)  Social Connections: Moderately Integrated (10/14/2023)  Tobacco Use: Low Risk  (10/13/2023)     Readmission Risk Interventions     No data to display

## 2023-11-01 ENCOUNTER — Telehealth: Payer: Self-pay | Admitting: *Deleted

## 2023-11-01 ENCOUNTER — Telehealth: Payer: Self-pay

## 2023-11-01 DIAGNOSIS — G459 Transient cerebral ischemic attack, unspecified: Secondary | ICD-10-CM

## 2023-11-01 NOTE — Patient Outreach (Signed)
 Stroke Discharge Follow-up   11/01/2023 Name:  Destiny Ruiz MRN:  [id number] DOB:  [birth year]  Subjective: Destiny Ruiz is a 89 y.o. year old female who is a primary care patient of Tobie Domino, MD An Emmi alert was received indicating patient responded to questions: Scheduled a follow-up appointment? Went to follow-up appointment?. I reached out by phone to follow up on the alert and spoke to Patient.  Care Management Interventions: Pt states her daughter is a Engineer, civil (consulting) and will schedule post hospital follow up appointment, declines assistance from RN CM, pt states she was discharged from [facility] on 6/27 after receiving inpatient therapy, pt states her daughter, son and granddaughter are involved in her care, provide oversight for appointments, medications, transportation, etc.  Unable to complete medication reconciliation due to pt states daughter  does all that and I can't tell you, I take what they tell me to  [facility] screenings completed.  Follow up plan: No further intervention required.  Mliss Creed [facility], BSN RN Care Manager/ Transition of Care [place]/ [facility] [phone]
